# Patient Record
Sex: Female | Born: 1960 | Race: White | Hispanic: No | Marital: Married | Smoking: Former smoker
Health system: Southern US, Community
[De-identification: ages and names within clinical notes are randomized; demographics above are authoritative.]

## PROBLEM LIST (undated history)

## (undated) DIAGNOSIS — T7840XA Allergy, unspecified, initial encounter: Secondary | ICD-10-CM

## (undated) DIAGNOSIS — R0602 Shortness of breath: Secondary | ICD-10-CM

## (undated) DIAGNOSIS — J449 Chronic obstructive pulmonary disease, unspecified: Secondary | ICD-10-CM

## (undated) DIAGNOSIS — J45909 Unspecified asthma, uncomplicated: Secondary | ICD-10-CM

## (undated) HISTORY — DX: Allergy, unspecified, initial encounter: T78.40XA

## (undated) HISTORY — DX: Shortness of breath: R06.02

## (undated) HISTORY — DX: Chronic obstructive pulmonary disease, unspecified: J44.9

## (undated) HISTORY — DX: Unspecified asthma, uncomplicated: J45.909

---

## 1997-04-18 HISTORY — PX: ABDOMINAL HYSTERECTOMY: SHX81

## 2004-12-10 ENCOUNTER — Ambulatory Visit: Payer: Self-pay | Admitting: Obstetrics and Gynecology

## 2005-09-29 ENCOUNTER — Ambulatory Visit: Payer: Self-pay | Admitting: Obstetrics and Gynecology

## 2006-08-17 IMAGING — US ULTRASOUND LEFT BREAST
1 series · 7 of 7 positions shown · non-contrast
Comparison: none

REASON FOR EXAM: LT breast lump  US PRN
COMMENTS:

[Series 1: ultrasound left breast · 7 of 7 slices shown]
[im 1/7]
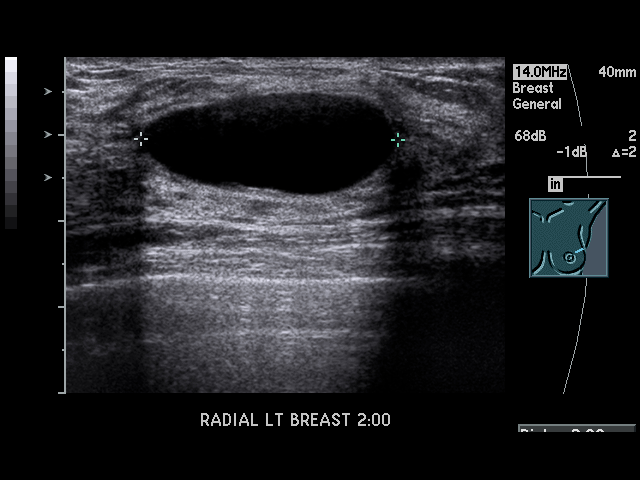
[im 2/7]
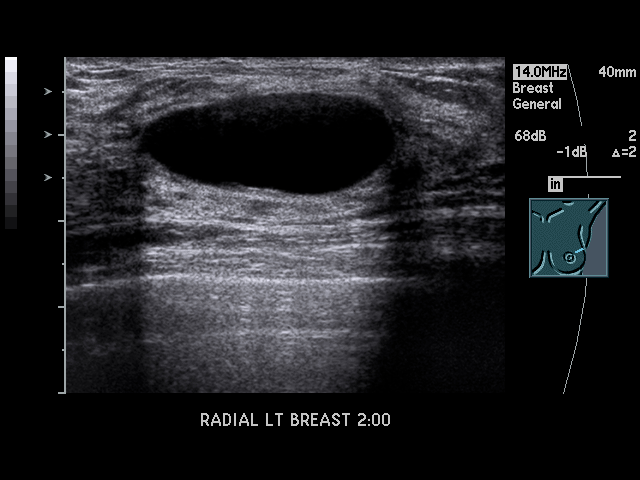
[im 3/7]
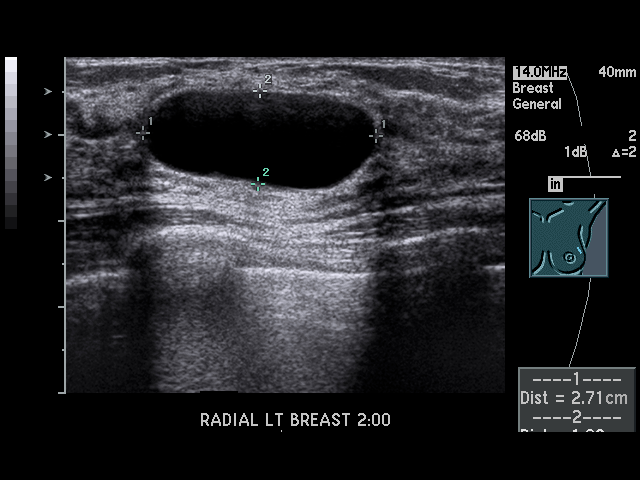
[im 4/7]
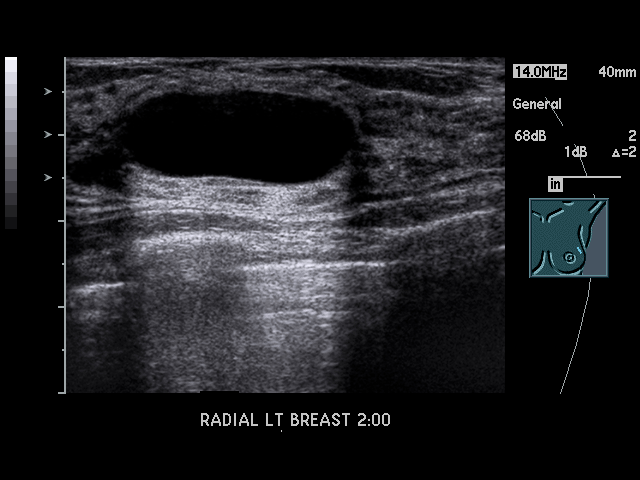
[im 5/7]
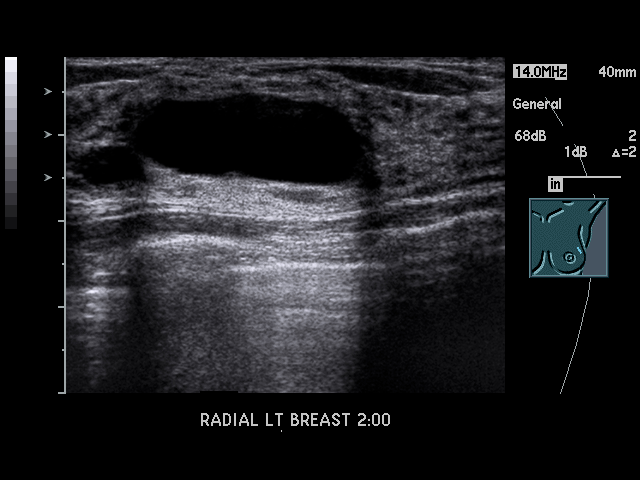
[im 6/7]
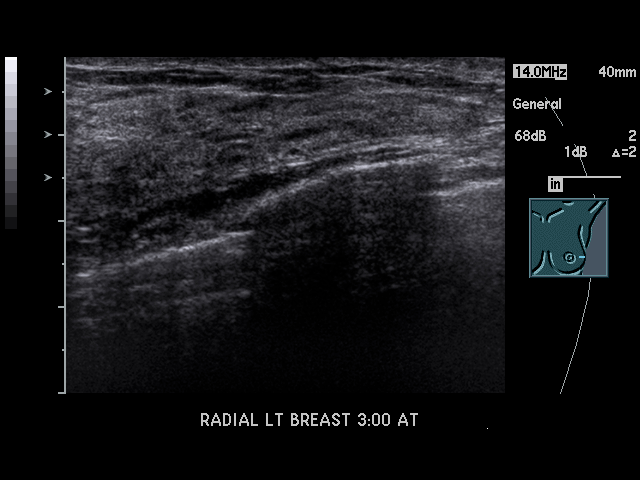
[im 7/7]
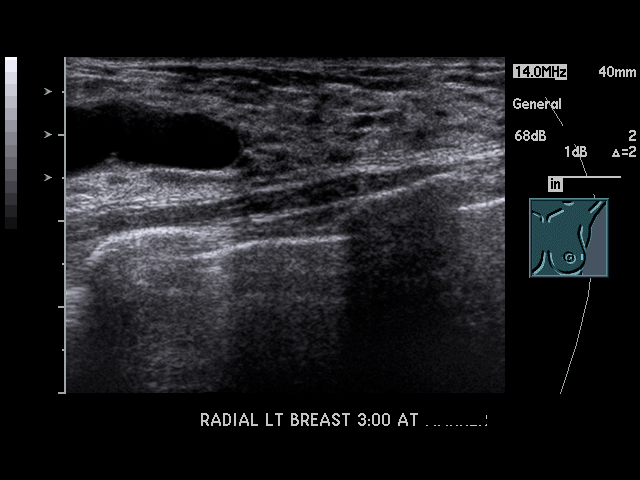

[7 of 7 positions shown; findings below may reference images not displayed]

PROCEDURE:     US  - US BREAST LEFT  - December 10, 2004 [DATE]

RESULT:        The patient is undergoing evaluation for an area of palpable
fullness on the LEFT.

Ultrasound today at the 2 o'clock position of the LEFT breast reveals an
ovoid simple-appearing cyst measuring 2.7 x 1.1 x 3.0 cm.  There are no
internal echoes and there is enhanced through transmission.   This is in
reasonable agreement with the area of clinical findings and mammographic
findings.  Please see the mammogram of this same date for final
recommendations and BI-RADS classification.
IMPRESSION: See above.

## 2007-06-06 IMAGING — US ULTRASOUND LEFT BREAST
1 series · 3 of 3 positions shown · non-contrast
Comparison: none

REASON FOR EXAM: LEFT breast mastodynia
COMMENTS:

PROCEDURE:     US  - US BREAST LEFT  - September 29, 2005  [DATE]
RESULT:     There is a smoothly marginated anechoic structure with enhanced
through transmission at the 2 o'clock position. It measures 2 x 1.1 x
cm.  It is most compatible with a benign cyst.

[Series 1: ultrasound left breast · 3 of 3 slices shown]
[im 1/3]
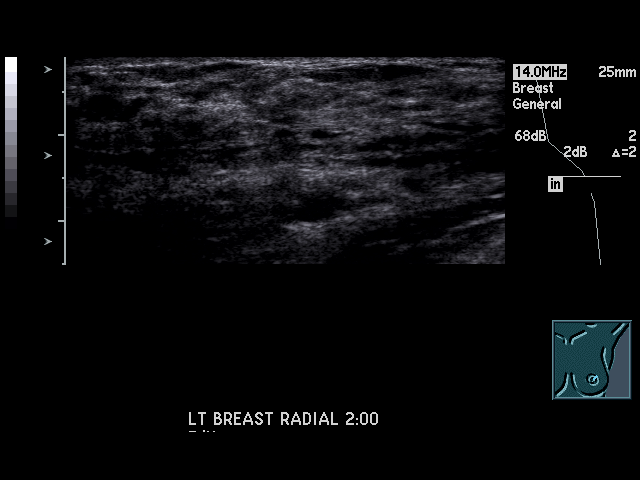
[im 2/3]
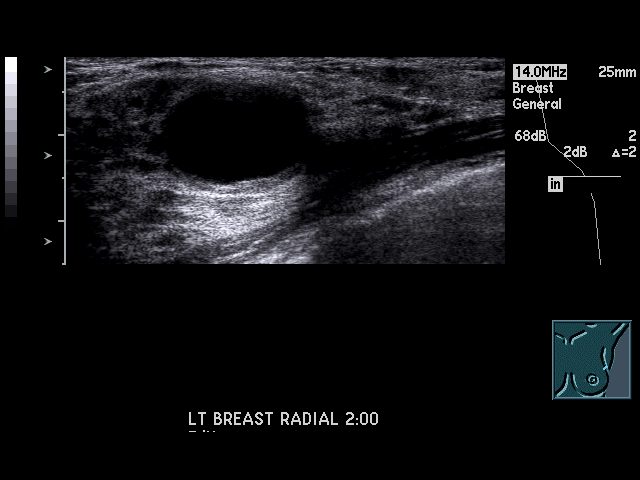
[im 3/3]
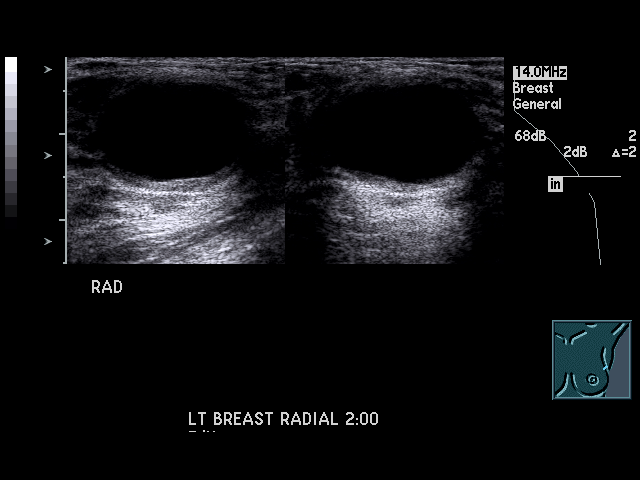

[3 of 3 positions shown; findings below may reference images not displayed]

IMPRESSION: There is a cystic-appearing structure at the 2 o'clock position of the LEFT
breast.  Please see the dictation of the mammogram of this same day for
final recommendations and BI-RADS classification.

By report there has not been dramatic change in the appearance of this
cystic structure since the prior ultrasound in Tuesday November, 2004.

## 2015-05-07 ENCOUNTER — Other Ambulatory Visit: Payer: Self-pay

## 2015-05-13 NOTE — Telephone Encounter (Signed)
Medication has been refused due to patient needs to schedule a medication refill appointment, has not been seen in this office

## 2015-06-03 ENCOUNTER — Ambulatory Visit (INDEPENDENT_AMBULATORY_CARE_PROVIDER_SITE_OTHER): Payer: 59 | Admitting: Family Medicine

## 2015-06-03 ENCOUNTER — Encounter: Payer: Self-pay | Admitting: Family Medicine

## 2015-06-03 VITALS — BP 110/67 | HR 102 | Temp 97.8°F | Resp 20 | Ht 60.0 in | Wt 128.2 lb

## 2015-06-03 DIAGNOSIS — J449 Chronic obstructive pulmonary disease, unspecified: Secondary | ICD-10-CM | POA: Diagnosis not present

## 2015-06-03 MED ORDER — PROAIR HFA 108 (90 BASE) MCG/ACT IN AERS: 1.0000 | INHALATION_SPRAY | Freq: Four times a day (QID) | RESPIRATORY_TRACT | Status: DC | PRN

## 2015-06-03 MED ORDER — SYMBICORT 160-4.5 MCG/ACT IN AERO
2.0000 | INHALATION_SPRAY | Freq: Two times a day (BID) | RESPIRATORY_TRACT | Status: DC
Start: 2015-06-03 — End: 2015-08-28

## 2015-06-03 NOTE — Progress Notes (Signed)
Name: Brittney Doyle   MRN: [id number]    DOB: 12-Jun-1960   Date:06/03/2015       Progress Note  Subjective  Chief Complaint  Chief Complaint  Patient presents with  . Medication Refill    symbicort 160-4.5 mcg/act / proair     HPI  COPD History of COPD, main symptoms of dyspnea, especially with activity. No coughing, wheezing, increased sputum production, etc.  Takes Symbicort twice daily and ProAir when needed (last use was 2 months ago).  Past Medical History  Diagnosis Date  . Shortness of breath   . COPD (chronic obstructive pulmonary disease) [facility])     Past Surgical History  Procedure Laterality Date  . Abdominal hysterectomy  1999    Family History  Problem Relation Age of Onset  . Heart attack Mother     Died of MI at age 71  . Heart attack Father     Died of MI at age 66  . Heart attack Brother     Social History   Social History  . Marital Status: Married    Spouse Name: N/A  . Number of Children: N/A  . Years of Education: N/A   Occupational History  . Not on file.   Social History Main Topics  . Smoking status: Former Research scientist (life sciences)  . Smokeless tobacco: Never Used  . Alcohol Use: No     Comment: ocaasional  . Drug Use: No  . Sexual Activity: No   Other Topics Concern  . Not on file   Social History Narrative  . No narrative on file     Current outpatient prescriptions:  .  fexofenadine-pseudoephedrine (ALLEGRA-D 24) 180-240 MG 24 hr tablet, Take 1 tablet by mouth daily., Disp: , Rfl:  .  PROAIR HFA 108 (90 Base) MCG/ACT inhaler, , Disp: , Rfl:  .  SYMBICORT 160-4.5 MCG/ACT inhaler, , Disp: , Rfl:   No Known Allergies   Review of Systems  Constitutional: Negative for fever and chills.  Respiratory: Negative for cough, sputum production and shortness of breath.     Objective  Filed Vitals:   06/03/15 1556  BP: 110/67  Pulse: 102  Temp: 97.8 F (36.6 C)  TempSrc: Oral  Resp: 20  Height: 5' (1.524 m)  Weight: 128 lb 3.2 oz  (58.151 kg)  SpO2: 96%    Physical Exam  Constitutional: She is oriented to person, place, and time and well-developed, well-nourished, and in no distress.  HENT:  Head: Normocephalic and atraumatic.  Cardiovascular: Normal rate and regular rhythm.   Pulmonary/Chest: Effort normal and breath sounds normal.  Neurological: She is alert and oriented to person, place, and time.  Nursing note and vitals reviewed.    Assessment & Plan  1. Chronic obstructive pulmonary disease, unspecified COPD type ([place]) Symptoms of COPD stable on rescue inhaler and ICS-LABA combination. - PROAIR HFA 108 (90 Base) MCG/ACT inhaler; Inhale 1 puff into the lungs every 6 (six) hours as needed for wheezing or shortness of breath.  Dispense: 3 Inhaler; Refill: 0 - SYMBICORT 160-4.5 MCG/ACT inhaler; Inhale 2 puffs into the lungs 2 (two) times daily.  Dispense: 3 Inhaler; Refill: 0   Ricci Paff Asad A. [facility] 06/03/2015 4:14 PM

## 2015-07-13 ENCOUNTER — Other Ambulatory Visit: Payer: Self-pay | Admitting: Family Medicine

## 2015-07-21 ENCOUNTER — Emergency Department
Admission: EM | Admit: 2015-07-21 | Discharge: 2015-07-21 | Disposition: A | Discharge status: Home / Self Care | Payer: 59 | Attending: Emergency Medicine | Admitting: Emergency Medicine

## 2015-07-21 ENCOUNTER — Encounter: Payer: Self-pay | Admitting: Family Medicine

## 2015-07-21 ENCOUNTER — Emergency Department: Payer: 59

## 2015-07-21 ENCOUNTER — Ambulatory Visit (INDEPENDENT_AMBULATORY_CARE_PROVIDER_SITE_OTHER): Payer: 59 | Admitting: Family Medicine

## 2015-07-21 VITALS — BP 118/67 | HR 109 | Temp 97.7°F | Resp 20 | Ht 60.0 in | Wt 120.0 lb

## 2015-07-21 DIAGNOSIS — Z87891 Personal history of nicotine dependence: Secondary | ICD-10-CM | POA: Diagnosis not present

## 2015-07-21 DIAGNOSIS — J440 Chronic obstructive pulmonary disease with acute lower respiratory infection: Secondary | ICD-10-CM | POA: Insufficient documentation

## 2015-07-21 DIAGNOSIS — J209 Acute bronchitis, unspecified: Secondary | ICD-10-CM

## 2015-07-21 DIAGNOSIS — J441 Chronic obstructive pulmonary disease with (acute) exacerbation: Secondary | ICD-10-CM | POA: Diagnosis not present

## 2015-07-21 DIAGNOSIS — R0602 Shortness of breath: Secondary | ICD-10-CM | POA: Diagnosis present

## 2015-07-21 LAB — CBC
HCT: 43.9 % (ref 35.0–47.0)
Hemoglobin: 15.2 g/dL (ref 12.0–16.0)
MCH: 32.6 pg (ref 26.0–34.0)
MCHC: 34.6 g/dL (ref 32.0–36.0)
MCV: 94 fL (ref 80.0–100.0)
PLATELETS: 273 10*3/uL (ref 150–440)
RBC: 4.67 MIL/uL (ref 3.80–5.20)
RDW: 13 % (ref 11.5–14.5)
WBC: 5.2 10*3/uL (ref 3.6–11.0)

## 2015-07-21 LAB — BASIC METABOLIC PANEL
Anion gap: 6 (ref 5–15)
BUN: 7 mg/dL (ref 6–20)
CHLORIDE: 108 mmol/L (ref 101–111)
CO2: 23 mmol/L (ref 22–32)
CREATININE: 0.61 mg/dL (ref 0.44–1.00)
Calcium: 9.3 mg/dL (ref 8.9–10.3)
GFR calc non Af Amer: >60 mL/min (ref >60)
Glucose, Bld: 110 mg/dL — ABNORMAL HIGH (ref 65–99)
Potassium: 3.4 mmol/L — ABNORMAL LOW (ref 3.5–5.1)
SODIUM: 137 mmol/L (ref 135–145)

## 2015-07-21 MED ORDER — ONDANSETRON HCL 4 MG/2ML IJ SOLN
INTRAMUSCULAR | Status: AC
  Filled 2015-07-21: qty 2

## 2015-07-21 MED ORDER — ALBUTEROL SULFATE (2.5 MG/3ML) 0.083% IN NEBU
5.0000 mg | INHALATION_SOLUTION | Freq: Once | RESPIRATORY_TRACT | Status: AC
  Administered 2015-07-21: 5 mg via RESPIRATORY_TRACT

## 2015-07-21 MED ORDER — DOXYCYCLINE HYCLATE 100 MG PO CAPS: 100.0000 mg | ORAL_CAPSULE | Freq: Two times a day (BID) | ORAL | Status: DC

## 2015-07-21 MED ORDER — PREDNISONE 20 MG PO TABS: 40.0000 mg | ORAL_TABLET | Freq: Every day | ORAL | Status: DC

## 2015-07-21 MED ORDER — ALBUTEROL SULFATE (2.5 MG/3ML) 0.083% IN NEBU
INHALATION_SOLUTION | RESPIRATORY_TRACT | Status: AC
  Filled 2015-07-21: qty 3

## 2015-07-21 NOTE — ED Notes (Signed)
EDP at bedside  

## 2015-07-21 NOTE — ED Provider Notes (Signed)
[facility] Emergency Department Provider Note  ____________________________________________  Time seen: 6:00 PM  I have reviewed the triage vital signs and the nursing notes.   HISTORY  Chief Complaint Shortness of Breath    HPI Brittney Doyle is a 55 y.o. female sent to the ED due to persistent shortness of breath. She is been treated with 10 days of Augmentin by primary care due to her pneumonia. She also has a history of COPD. She reports she has been continuing to have some shortness of breath worse with exertion, and some nonproductive cough. No fever. No chest pain. No dizziness or other exertional symptoms. Take Symbicort and albuterol at home.She does report decreased oral intake over the last few days due to not having much of an appetite.   No recent travel trauma hospitalizations or surgeries. No history of DVT or PE.  Past Medical History  Diagnosis Date  . COPD (chronic obstructive pulmonary disease) ([place])   . Shortness of breath      Patient Active Problem List   Diagnosis Date Noted  . COPD with acute exacerbation ([place]) 07/21/2015  . COPD (chronic obstructive pulmonary disease) ([place]) 06/03/2015     Past Surgical History  Procedure Laterality Date  . Abdominal hysterectomy  1999     Current Outpatient Rx  Name  Route  Sig  Dispense  Refill  . amoxicillin-clavulanate (AUGMENTIN) 875-125 MG tablet      TAKE 1 TABLET BY MOUTH EVERY 12 HOURS FOR 10 DAYS      0   . doxycycline (VIBRAMYCIN) 100 MG capsule   Oral   Take 1 capsule (100 mg total) by mouth 2 (two) times daily.   28 capsule   0   . fexofenadine-pseudoephedrine (ALLEGRA-D 24) 180-240 MG 24 hr tablet   Oral   Take 1 tablet by mouth daily.         . fluticasone (FLONASE) 50 MCG/ACT nasal spray      SPRAY 2 SPRAY,SUSPENSION ONCE A DAY FOR 7 DAYS.2 PUFFS IN EACH NOSTRIL ONCE A DAY FOR 7 DAYS      0   . predniSONE (DELTASONE) 20 MG tablet   Oral   Take 2  tablets (40 mg total) by mouth daily.   8 tablet   0   . PROAIR HFA 108 (90 Base) MCG/ACT inhaler   Inhalation   Inhale 1 puff into the lungs every 6 (six) hours as needed for wheezing or shortness of breath.   3 Inhaler   0     Dispense as written.   . SYMBICORT 160-4.5 MCG/ACT inhaler   Inhalation   Inhale 2 puffs into the lungs 2 (two) times daily.   3 Inhaler   0     Dispense as written.      Allergies Review of patient's allergies indicates no known allergies.   Family History  Problem Relation Age of Onset  . Heart attack Mother     Died of MI at age 7  . Heart attack Father     Died of MI at age 72  . Heart attack Brother     Social History Social History  Substance Use Topics  . Smoking status: Former Research scientist (life sciences)  . Smokeless tobacco: Never Used  . Alcohol Use: No     Comment: ocaasional    Review of Systems  Constitutional:   No fever or chills. No weight changes Eyes:   No vision changes.  ENT:   No sore  throat. No rhinorrhea. Cardiovascular:   No chest pain. Respiratory:   Positive shortness of breath and nonproductive cough. Gastrointestinal:   Negative for abdominal pain, vomiting and diarrhea.  No BRBPR or melena. Genitourinary:   Negative for dysuria or difficulty urinating. Musculoskeletal:   Negative for focal pain or swelling Skin:   Negative for rash. Neurological:   Negative for headaches, focal weakness or numbness.  10-point ROS otherwise negative.  ____________________________________________   PHYSICAL EXAM:  VITAL SIGNS: ED Triage Vitals  Enc Vitals Group     BP 07/21/15 1421 138/87 mmHg     Pulse Rate 07/21/15 1421 100     Resp 07/21/15 1421 20     Temp 07/21/15 1421 97.6 F (36.4 C)     Temp Source 07/21/15 1421 Oral     SpO2 07/21/15 1421 96 %     Weight 07/21/15 1421 120 lb (54.432 kg)     Height 07/21/15 1421 5' (1.524 m)     Head Cir --      Peak Flow --      Pain Score --      Pain Loc --      Pain Edu? --       Excl. in [place]? --     Vital signs reviewed, nursing assessments reviewed.   Constitutional:   Alert and oriented. Well appearing and in no distress. Eyes:   No scleral icterus. No conjunctival pallor. PERRL. EOMI ENT   Head:   Normocephalic and atraumatic.   Nose:   No congestion/rhinnorhea. No septal hematoma   Mouth/Throat:   MMM, no pharyngeal erythema. No peritonsillar mass.    Neck:   No stridor. No SubQ emphysema. No meningismus. Hematological/Lymphatic/Immunilogical:   No cervical lymphadenopathy. Cardiovascular:   RRR. Symmetric bilateral radial and DP pulses.  No murmurs.  Respiratory:   Normal respiratory effort without tachypnea nor retractions. Diminished air entry diffusely, normal expiratory phase, no expiratory wheezing. No focal crackles are consolidative findings. Gastrointestinal:   Soft and nontender. Non distended. There is no CVA tenderness.  No rebound, rigidity, or guarding. Genitourinary:   deferred Musculoskeletal:   Nontender with normal range of motion in all extremities. No joint effusions.  No lower extremity tenderness.  No edema. Neurologic:   Normal speech and language.  CN 2-10 normal. Motor grossly intact. No gross focal neurologic deficits are appreciated.  Skin:    Skin is warm, dry and intact. No rash noted.  No petechiae, purpura, or bullae. Psychiatric:   Mood and affect are normal. ____________________________________________    LABS (pertinent positives/negatives) (all labs ordered are listed, but only abnormal results are displayed) Labs Reviewed  BASIC METABOLIC PANEL - Abnormal; Notable for the following:    Potassium 3.4 (*)    Glucose, Bld 110 (*)    All other components within normal limits  CBC   ____________________________________________   EKG  Interpreted by me Normal sinus rhythm rate of 92, normal axis, normal intervals. Normal QRS ST segments and T waves.  ____________________________________________     RADIOLOGY  Chest x-ray unremarkable, consistent with COPD  ____________________________________________   PROCEDURES   ____________________________________________   INITIAL IMPRESSION / ASSESSMENT AND PLAN / ED COURSE  Pertinent labs & imaging results that were available during my care of the patient were reviewed by me and considered in my medical decision making (see chart for details).  Patient well appearing no acute distress. May be having a mild COPD exacerbation but no significant wheezing or bronchospasm or hypoxia.  We'll start her on a short burst of steroids. Also continue her on doxycycline given her underlying lung disease. Patient is comfortable with this and wants to go home. Vital signs are unremarkable. She was mildly tachycardic in triage but this has improved just on recheck in the treatment area. I think this is properly due to decreased oral intake. Have low suspicion for MI heart failure or PE. Return precautions given, patient will return if she develops any chest pain or worsening symptoms.     ____________________________________________   FINAL CLINICAL IMPRESSION(S) / ED DIAGNOSES  Final diagnoses:  Acute bronchitis, unspecified organism      Carrie Mew, MD 07/21/15 1910

## 2015-07-21 NOTE — Progress Notes (Signed)
Name: Brittney Doyle   MRN: [id number]    DOB: 09-07-1960   Date:07/21/2015       Progress Note  Subjective  Chief Complaint  Chief Complaint  Patient presents with  . Acute Visit    Wheezing, chest tightness, SOB    HPI  Pt. Presents with 2 weeks history of sore throat, dry cough, and fevers, chills. She was prescribed Amoxicillin, Flonase, and Cetirizine by [facility] through her work. She felt better until last week but then started experiencing worsening shortness of breath, chest tightness and wheezing in the last 2-3 days. She is not producing any mucus, fever hs resolved, throat is till somewhat sore. Short of breath with walking and even sitting still.   Past Medical History  Diagnosis Date  . COPD (chronic obstructive pulmonary disease) ([place])   . Shortness of breath     Past Surgical History  Procedure Laterality Date  . Abdominal hysterectomy  1999    Family History  Problem Relation Age of Onset  . Heart attack Mother     Died of MI at age 55  . Heart attack Father     Died of MI at age 85  . Heart attack Brother     Social History   Social History  . Marital Status: Married    Spouse Name: N/A  . Number of Children: N/A  . Years of Education: N/A   Occupational History  . Not on file.   Social History Main Topics  . Smoking status: Former Research scientist (life sciences)  . Smokeless tobacco: Never Used  . Alcohol Use: No     Comment: ocaasional  . Drug Use: No  . Sexual Activity: No   Other Topics Concern  . Not on file   Social History Narrative     Current outpatient prescriptions:  .  amoxicillin-clavulanate (AUGMENTIN) 875-125 MG tablet, TAKE 1 TABLET BY MOUTH EVERY 12 HOURS FOR 10 DAYS, Disp: , Rfl: 0 .  fexofenadine-pseudoephedrine (ALLEGRA-D 24) 180-240 MG 24 hr tablet, Take 1 tablet by mouth daily., Disp: , Rfl:  .  fluticasone (FLONASE) 50 MCG/ACT nasal spray, SPRAY 2 SPRAY,SUSPENSION ONCE A DAY FOR 7 DAYS.2 PUFFS IN EACH NOSTRIL ONCE A DAY FOR 7 DAYS, Disp: ,  Rfl: 0 .  PROAIR HFA 108 (90 Base) MCG/ACT inhaler, Inhale 1 puff into the lungs every 6 (six) hours as needed for wheezing or shortness of breath., Disp: 3 Inhaler, Rfl: 0 .  SYMBICORT 160-4.5 MCG/ACT inhaler, Inhale 2 puffs into the lungs 2 (two) times daily., Disp: 3 Inhaler, Rfl: 0  No Known Allergies   Review of Systems  Constitutional: Negative for fever and chills.  HENT: Positive for sore throat.   Respiratory: Positive for cough, shortness of breath and wheezing. Negative for sputum production.     Objective  Filed Vitals:   07/21/15 1329  BP: 118/67  Pulse: 109  Temp: 97.7 F (36.5 C)  TempSrc: Oral  Resp: 20  Height: 5' (1.524 m)  Weight: 120 lb (54.432 kg)  SpO2: 96%    Physical Exam  Constitutional: She is well-developed, well-nourished, and in no distress.  HENT:  Mouth/Throat: No posterior oropharyngeal erythema.  Cardiovascular: Regular rhythm and normal heart sounds.  Tachycardia present.   Pulmonary/Chest: She has decreased breath sounds. She has no wheezes.  Decreased breath sounds throughout, end-expiratory crackles in right lung field, no wheezes.  Nursing note and vitals reviewed.     Assessment & Plan  1. COPD with acute exacerbation (  HCC) Coughing, dyspnea, and poor air entry on exam, consistent with acute COPD exacerbation. We'll refer patient to the ER for further management and assessment.   Brittney Doyle Asad A. [facility] 07/21/2015 1:35 PM

## 2015-07-21 NOTE — ED Notes (Signed)
Pt c/o SOB over the past 2 weeks, was sent by PCP who stated pt has crackles in lung bases.. States she is on the 10th day of abx.. States she has a nonproductive cough.

## 2015-07-21 NOTE — Discharge Instructions (Signed)

## 2015-07-21 NOTE — ED Notes (Signed)
Pt states she has been on abx amoxicillin for 10 days for A chest cold but states yesterday her SOB increased, upon assessment pt appears somewhat SOB upon excertion, awake and alert

## 2015-07-30 ENCOUNTER — Encounter: Payer: Self-pay | Admitting: Family Medicine

## 2015-07-30 ENCOUNTER — Ambulatory Visit (INDEPENDENT_AMBULATORY_CARE_PROVIDER_SITE_OTHER): Payer: 59 | Admitting: Family Medicine

## 2015-07-30 VITALS — BP 118/66 | HR 95 | Temp 97.8°F | Resp 18 | Ht 60.0 in | Wt 120.1 lb

## 2015-07-30 DIAGNOSIS — J302 Other seasonal allergic rhinitis: Secondary | ICD-10-CM

## 2015-07-30 DIAGNOSIS — J209 Acute bronchitis, unspecified: Secondary | ICD-10-CM | POA: Diagnosis not present

## 2015-07-30 MED ORDER — FLUTICASONE PROPIONATE 50 MCG/ACT NA SUSP: 2.0000 | Freq: Every day | NASAL | Status: DC

## 2015-07-30 MED ORDER — BENZONATATE 200 MG PO CAPS: 200.0000 mg | ORAL_CAPSULE | Freq: Three times a day (TID) | ORAL | Status: DC | PRN

## 2015-07-30 NOTE — Progress Notes (Signed)
Name: Brittney Doyle   MRN: [id number]    DOB: 1960/04/30   Date:07/30/2015       Progress Note  Subjective  Chief Complaint  Chief Complaint  Patient presents with  . Annual Exam    CPE    HPI  Pt. Is here for hospital Follow-up. She was sent to the ER on April 4th for  difficulty breathing. She was evaluated in the ER and diagnosed with Acute bronchitis, started on prednisone for 4 days and Doxycycline for 14 days. She has finished Prednisone and continuing on Doxycycline.  Coughing has improved, breathing is back to baseline.  She is taking Mucinex for residual cough and congestion.  Past Medical History  Diagnosis Date  . COPD (chronic obstructive pulmonary disease) ([place])   . Shortness of breath     Past Surgical History  Procedure Laterality Date  . Abdominal hysterectomy  1999    Family History  Problem Relation Age of Onset  . Heart attack Mother     Died of MI at age 70  . Heart attack Father     Died of MI at age 84  . Heart attack Brother     Social History   Social History  . Marital Status: Married    Spouse Name: N/A  . Number of Children: N/A  . Years of Education: N/A   Occupational History  . Not on file.   Social History Main Topics  . Smoking status: Former Research scientist (life sciences)  . Smokeless tobacco: Never Used  . Alcohol Use: No     Comment: ocaasional  . Drug Use: No  . Sexual Activity: No   Other Topics Concern  . Not on file   Social History Narrative     Current outpatient prescriptions:  .  doxycycline (VIBRAMYCIN) 100 MG capsule, Take 1 capsule (100 mg total) by mouth 2 (two) times daily., Disp: 28 capsule, Rfl: 0 .  fexofenadine-pseudoephedrine (ALLEGRA-D 24) 180-240 MG 24 hr tablet, Take 1 tablet by mouth daily., Disp: , Rfl:  .  fluticasone (FLONASE) 50 MCG/ACT nasal spray, SPRAY 2 SPRAY,SUSPENSION ONCE A DAY FOR 7 DAYS.2 PUFFS IN EACH NOSTRIL ONCE A DAY FOR 7 DAYS, Disp: , Rfl: 0 .  PROAIR HFA 108 (90 Base) MCG/ACT inhaler, Inhale 1  puff into the lungs every 6 (six) hours as needed for wheezing or shortness of breath., Disp: 3 Inhaler, Rfl: 0 .  SYMBICORT 160-4.5 MCG/ACT inhaler, Inhale 2 puffs into the lungs 2 (two) times daily., Disp: 3 Inhaler, Rfl: 0  No Known Allergies   Review of Systems  Constitutional: Negative for fever and chills.  Respiratory: Positive for cough and wheezing. Negative for shortness of breath.   Cardiovascular: Negative for chest pain.    Objective  Filed Vitals:   07/30/15 1413  BP: 118/66  Pulse: 95  Temp: 97.8 F (36.6 C)  TempSrc: Oral  Resp: 18  Height: 5' (1.524 m)  Weight: 120 lb 1.6 oz (54.477 kg)  SpO2: 96%    Physical Exam  Constitutional: She is oriented to person, place, and time and well-developed, well-nourished, and in no distress.  HENT:  Head: Normocephalic and atraumatic.  Mouth/Throat: Mucous membranes are normal.  Cardiovascular: Normal rate and regular rhythm.   Pulmonary/Chest: Effort normal and breath sounds normal. No respiratory distress. She has no wheezes.  Neurological: She is alert and oriented to person, place, and time.  Nursing note and vitals reviewed.     Assessment & Plan  1. Acute  bronchitis, unspecified organism We will start on Tessalon to relieve cough. Will finished antibiotic therapy. - benzonatate (TESSALON) 200 MG capsule; Take 1 capsule (200 mg total) by mouth 3 (three) times daily as needed for cough.  Dispense: 20 capsule; Refill: 0  2. Seasonal allergic rhinitis  - fluticasone (FLONASE) 50 MCG/ACT nasal spray; Place 2 sprays into both nostrils daily.  Dispense: 16 g; Refill: 0  Cannon Quinton Asad A. [facility] 07/30/2015 2:30 PM

## 2015-08-28 ENCOUNTER — Other Ambulatory Visit: Payer: Self-pay | Admitting: Family Medicine

## 2015-08-31 ENCOUNTER — Encounter: Payer: 59 | Admitting: Family Medicine

## 2015-09-28 ENCOUNTER — Other Ambulatory Visit: Payer: Self-pay | Admitting: Family Medicine

## 2015-11-09 ENCOUNTER — Ambulatory Visit (INDEPENDENT_AMBULATORY_CARE_PROVIDER_SITE_OTHER): Payer: 59 | Admitting: Family Medicine

## 2015-11-09 ENCOUNTER — Encounter: Payer: Self-pay | Admitting: Family Medicine

## 2015-11-09 DIAGNOSIS — F418 Other specified anxiety disorders: Secondary | ICD-10-CM

## 2015-11-09 DIAGNOSIS — J449 Chronic obstructive pulmonary disease, unspecified: Secondary | ICD-10-CM

## 2015-11-09 MED ORDER — LORAZEPAM 1 MG PO TABS: 1.0000 mg | ORAL_TABLET | Freq: Every day | ORAL | 0 refills | Status: AC | PRN

## 2015-11-09 MED ORDER — TIOTROPIUM BROMIDE MONOHYDRATE 2.5 MCG/ACT IN AERS: 2.0000 | INHALATION_SPRAY | Freq: Every day | RESPIRATORY_TRACT | 2 refills | Status: DC

## 2015-11-09 MED ORDER — PROAIR HFA 108 (90 BASE) MCG/ACT IN AERS: 2.0000 | INHALATION_SPRAY | Freq: Four times a day (QID) | RESPIRATORY_TRACT | 2 refills | Status: DC | PRN

## 2015-11-09 MED ORDER — PREDNISONE 10 MG PO TABS: 10.0000 mg | ORAL_TABLET | Freq: Every day | ORAL | 0 refills | Status: DC

## 2015-11-09 NOTE — Progress Notes (Signed)
Name: Brittney Doyle   MRN: [id number]    DOB: [birth year]   Date:11/09/2015       Progress Note  Subjective  Chief Complaint  Chief Complaint  Patient presents with  . Chills    last week for 3 days  . Shortness of Breath    more than normal last week    Shortness of Breath  This is a recurrent problem. The current episode started in the past 7 days. Pertinent negatives include no chest pain (felt pressure in her chest), fever, orthopnea or sputum production. Her past medical history is significant for COPD.   Anxiety: Pt. Is going to see her daughter who lives in [place] this coming weekend, her husband is going to be driving her halfway and her daughter will pick her up from there. Patient has anxiety from riding in a car on the highway and needs something to help relieve her anxiety for the trip back and forth.   Past Medical History:  Diagnosis Date  . COPD (chronic obstructive pulmonary disease) ([place])   . Shortness of breath     Past Surgical History:  Procedure Laterality Date  . ABDOMINAL HYSTERECTOMY  1999    Family History  Problem Relation Age of Onset  . Heart attack Mother     Died of MI at age 51  . Heart attack Father     Died of MI at age 2  . Heart attack Brother     Social History   Social History  . Marital status: Married    Spouse name: N/A  . Number of children: N/A  . Years of education: N/A   Occupational History  . Not on file.   Social History Main Topics  . Smoking status: Former Research scientist (life sciences)  . Smokeless tobacco: Never Used  . Alcohol use No     Comment: ocaasional  . Drug use: No  . Sexual activity: No   Other Topics Concern  . Not on file   Social History Narrative  . No narrative on file     Current Outpatient Prescriptions:  .  fexofenadine-pseudoephedrine (ALLEGRA-D 24) 180-240 MG 24 hr tablet, Take 1 tablet by mouth daily., Disp: , Rfl:  .  fluticasone (FLONASE) 50 MCG/ACT nasal spray, Use 2 sprays in each  nostril  daily, Disp: 16 g, Rfl: 2 .  PROAIR HFA 108 (90 Base) MCG/ACT inhaler, Inhale 1 puff into the lungs every 6 (six) hours as needed for wheezing or shortness of breath., Disp: 3 Inhaler, Rfl: 0 .  SYMBICORT 160-4.5 MCG/ACT inhaler, Inhale 2 puffs two times  daily, Disp: 30.6 g, Rfl: 2 .  benzonatate (TESSALON) 200 MG capsule, Take 1 capsule (200 mg total) by mouth 3 (three) times daily as needed for cough. (Patient not taking: Reported on 11/09/2015), Disp: 20 capsule, Rfl: 0 .  doxycycline (VIBRAMYCIN) 100 MG capsule, Take 1 capsule (100 mg total) by mouth 2 (two) times daily. (Patient not taking: Reported on 11/09/2015), Disp: 28 capsule, Rfl: 0  No Known Allergies   Review of Systems  Constitutional: Negative for chills and fever.  Respiratory: Positive for shortness of breath. Negative for cough and sputum production.   Cardiovascular: Negative for chest pain (felt pressure in her chest) and orthopnea.  Psychiatric/Behavioral: The patient is nervous/anxious.      Objective  Vitals:   11/09/15 1616  BP: 110/76  Pulse: (!) 102  Resp: 18  Temp: 98.2 F (36.8 C)  SpO2: 95%  Weight: 121  lb 4 oz (55 kg)  Height: 5' (1.524 m)    Physical Exam  Constitutional: She is well-developed, well-nourished, and in no distress.  Cardiovascular: Normal rate, regular rhythm and normal heart sounds.   No murmur heard. Pulmonary/Chest: Breath sounds normal. She has no wheezes. She has no rales (mild crackles in left mid to lower lung field).  Nursing note and vitals reviewed.      Assessment & Plan  1. Chronic obstructive pulmonary disease, unspecified COPD type ([place]) Symptomatically worse after patient had bronchitis with exacerbation of COPD 2 months ago. We will add anticholinergic and a tapering course of prednisone to relieve shortness of breath. - PROAIR HFA 108 (90 Base) MCG/ACT inhaler; Inhale 2 puffs into the lungs every 6 (six) hours as needed for wheezing or shortness of breath.   Dispense: 3 Inhaler; Refill: 2 - Tiotropium Bromide Monohydrate (SPIRIVA RESPIMAT) 2.5 MCG/ACT AERS; Inhale 2 puffs into the lungs daily.  Dispense: 3 Inhaler; Refill: 2 - predniSONE (DELTASONE) 10 MG tablet; Take 1 tablet (10 mg total) by mouth daily with breakfast. 50 40 30 20 10  then STOP.  Dispense: 15 tablet; Refill: 0  2. Situational anxiety Patient has anxiety from being on the [employer]. Prescribed lorazepam to be taken while she is visiting her daughter. - LORazepam (ATIVAN) 1 MG tablet; Take 1 tablet (1 mg total) by mouth daily as needed for anxiety.  Dispense: 2 tablet; Refill: 0   Brittney Doyle A. [facility] 11/09/2015 4:27 PM

## 2015-11-10 ENCOUNTER — Encounter: Payer: Self-pay | Admitting: Family Medicine

## 2015-11-10 MED ORDER — TIOTROPIUM BROMIDE MONOHYDRATE 2.5 MCG/ACT IN AERS: 2.0000 | INHALATION_SPRAY | Freq: Every day | RESPIRATORY_TRACT | 2 refills | Status: DC

## 2015-11-10 NOTE — Addendum Note (Signed)
Addended byManuella Ghazi, Brier Reid A A on: 11/10/2015 09:15 AM   Modules accepted: Orders

## 2016-02-26 ENCOUNTER — Other Ambulatory Visit: Payer: Self-pay | Admitting: Family Medicine

## 2016-02-26 DIAGNOSIS — J449 Chronic obstructive pulmonary disease, unspecified: Secondary | ICD-10-CM

## 2016-07-07 ENCOUNTER — Other Ambulatory Visit: Payer: Self-pay | Admitting: Family Medicine

## 2016-07-07 DIAGNOSIS — J449 Chronic obstructive pulmonary disease, unspecified: Secondary | ICD-10-CM

## 2016-08-27 ENCOUNTER — Other Ambulatory Visit: Payer: Self-pay | Admitting: Family Medicine

## 2016-08-27 DIAGNOSIS — J449 Chronic obstructive pulmonary disease, unspecified: Secondary | ICD-10-CM

## 2016-09-13 ENCOUNTER — Telehealth: Payer: Self-pay | Admitting: Family Medicine

## 2016-09-13 NOTE — Telephone Encounter (Signed)
Pt has scheduled an appointment for this Thursday 09/15/16. She is asking if you would please call in her prescription for spiriva to [facility] because it will take awhile before she receive them through mail.  [phone]

## 2016-09-15 ENCOUNTER — Ambulatory Visit: Payer: Self-pay | Admitting: Family Medicine

## 2016-09-16 ENCOUNTER — Encounter: Payer: Self-pay | Admitting: Family Medicine

## 2016-09-16 ENCOUNTER — Ambulatory Visit (INDEPENDENT_AMBULATORY_CARE_PROVIDER_SITE_OTHER): Payer: 59 | Admitting: Family Medicine

## 2016-09-16 VITALS — BP 112/73 | HR 98 | Temp 97.4°F | Resp 16 | Ht 60.0 in | Wt 114.6 lb

## 2016-09-16 DIAGNOSIS — F418 Other specified anxiety disorders: Secondary | ICD-10-CM | POA: Diagnosis not present

## 2016-09-16 DIAGNOSIS — J301 Allergic rhinitis due to pollen: Secondary | ICD-10-CM | POA: Diagnosis not present

## 2016-09-16 DIAGNOSIS — J449 Chronic obstructive pulmonary disease, unspecified: Secondary | ICD-10-CM | POA: Diagnosis not present

## 2016-09-16 MED ORDER — TIOTROPIUM BROMIDE MONOHYDRATE 2.5 MCG/ACT IN AERS
2.0000 | INHALATION_SPRAY | Freq: Every day | RESPIRATORY_TRACT | 2 refills | Status: DC
Start: 2016-09-16 — End: 2017-04-17

## 2016-09-16 MED ORDER — PROAIR HFA 108 (90 BASE) MCG/ACT IN AERS: INHALATION_SPRAY | RESPIRATORY_TRACT | 2 refills | Status: DC

## 2016-09-16 MED ORDER — FLUTICASONE PROPIONATE 50 MCG/ACT NA SUSP: 2.0000 | Freq: Every day | NASAL | 2 refills | Status: DC

## 2016-09-16 MED ORDER — LORAZEPAM 1 MG PO TABS: 1.0000 mg | ORAL_TABLET | Freq: Every day | ORAL | 1 refills | Status: DC | PRN

## 2016-09-16 MED ORDER — TIOTROPIUM BROMIDE MONOHYDRATE 2.5 MCG/ACT IN AERS: 2.0000 | INHALATION_SPRAY | Freq: Every day | RESPIRATORY_TRACT | 2 refills | Status: DC

## 2016-09-16 MED ORDER — PROAIR HFA 108 (90 BASE) MCG/ACT IN AERS
INHALATION_SPRAY | RESPIRATORY_TRACT | 2 refills | Status: DC
Start: 2016-09-16 — End: 2017-07-10

## 2016-09-16 MED ORDER — SYMBICORT 160-4.5 MCG/ACT IN AERO
INHALATION_SPRAY | RESPIRATORY_TRACT | 2 refills | Status: DC
Start: 2016-09-16 — End: 2016-09-16

## 2016-09-16 MED ORDER — SYMBICORT 160-4.5 MCG/ACT IN AERO: INHALATION_SPRAY | RESPIRATORY_TRACT | 2 refills | Status: DC

## 2016-09-16 NOTE — Progress Notes (Signed)
Name: Brittney Doyle   MRN: [id number]    DOB: Jan 27, 1961   Date:09/16/2016       Progress Note  Subjective  Chief Complaint  Chief Complaint  Patient presents with  . Medication Refill    HPI  COPD: Pt. Presents for medication refills, she is on Symbicort and Spiriva for maintenance and ProAir for rescue breathing. She reports having good days and better days, 2 weeks ago she had some episodes of shortness of breath despite being on her inhalers but feels well now.  She has no coughing or wheezing.   Seasonal Allergies: Has allergic symptoms in both spring and fall, worse in fall, symptoms include sneezing sinus pressure and drainage (worse at night). She takes Flonase which seems to help to some extent.   Anxiety: Pt. is going to see her daughter who lives in [place], trip planned for July 2018. Her husband is going to be driving her halfway and her daughter will pick her up from there. Patient has anxiety from riding in a car on the highway and needs something to help relieve her anxiety for the trip back and forth. She took Lorazepam last time which helped relieve her anxiety.   Past Medical History:  Diagnosis Date  . COPD (chronic obstructive pulmonary disease) ([place])   . Shortness of breath     Past Surgical History:  Procedure Laterality Date  . ABDOMINAL HYSTERECTOMY  1999    Family History  Problem Relation Age of Onset  . Heart attack Mother        Died of MI at age 85  . Heart attack Father        Died of MI at age 21  . Heart attack Brother     Social History   Social History  . Marital status: Married    Spouse name: N/A  . Number of children: N/A  . Years of education: N/A   Occupational History  . Not on file.   Social History Main Topics  . Smoking status: Former Research scientist (life sciences)  . Smokeless tobacco: Never Used  . Alcohol use No     Comment: ocaasional  . Drug use: No  . Sexual activity: No   Other Topics Concern  . Not on file   Social  History Narrative  . No narrative on file     Current Outpatient Prescriptions:  .  benzonatate (TESSALON) 200 MG capsule, Take 1 capsule (200 mg total) by mouth 3 (three) times daily as needed for cough. (Patient not taking: Reported on 11/09/2015), Disp: 20 capsule, Rfl: 0 .  fexofenadine-pseudoephedrine (ALLEGRA-D 24) 180-240 MG 24 hr tablet, Take 1 tablet by mouth daily., Disp: , Rfl:  .  fluticasone (FLONASE) 50 MCG/ACT nasal spray, USE 2 SPRAYS IN EACH  NOSTRIL DAILY, Disp: 48 g, Rfl: 2 .  predniSONE (DELTASONE) 10 MG tablet, Take 1 tablet (10 mg total) by mouth daily with breakfast. 50 40 30 20 10  then STOP. (Patient not taking: Reported on 09/16/2016), Disp: 15 tablet, Rfl: 0 .  PROAIR HFA 108 (90 Base) MCG/ACT inhaler, USE 2 PUFFS EVERY 6 HOURS  AS NEEDED FOR WHEEZING OR  SHORTNESS OF BREATH, Disp: 34 g, Rfl: 2 .  SYMBICORT 160-4.5 MCG/ACT inhaler, INHALE 2 PUFFS TWO TIMES  DAILY, Disp: 30.6 g, Rfl: 2 .  Tiotropium Bromide Monohydrate (SPIRIVA RESPIMAT) 2.5 MCG/ACT AERS, Inhale 2 puffs into the lungs daily., Disp: 3 Inhaler, Rfl: 2  No Known Allergies   ROS  Please see  history of present illness for complete discussion of ROS  Objective  Vitals:   09/16/16 1432  BP: 112/73  Pulse: 98  Resp: 16  Temp: 97.4 F (36.3 C)  TempSrc: Oral  SpO2: 97%  Weight: 114 lb 9.6 oz (52 kg)  Height: 5' (1.524 m)    Physical Exam  Constitutional: She is oriented to person, place, and time and well-developed, well-nourished, and in no distress.  HENT:  Head: Normocephalic and atraumatic.  Mouth/Throat: Mucous membranes are normal. No posterior oropharyngeal erythema.  Nasal turbinates hypertrophied, mucosal inflammation.   Cardiovascular: Normal rate, regular rhythm and normal heart sounds.   Pulmonary/Chest: Effort normal and breath sounds normal. No respiratory distress. She has no wheezes.  Neurological: She is alert and oriented to person, place, and time.  Nursing note and vitals  reviewed.    Assessment & Plan 1. Chronic obstructive pulmonary disease, unspecified COPD type ([place]) Stable, continue on Symbicort, Pro Air and Spiriva. Sample for Trelegy provided to see if it works well for patient and has no side effects.  - Tiotropium Bromide Monohydrate (SPIRIVA RESPIMAT) 2.5 MCG/ACT AERS; Inhale 2 puffs into the lungs daily.  Dispense: 3 Inhaler; Refill: 2 - SYMBICORT 160-4.5 MCG/ACT inhaler; INHALE 2 PUFFS TWO TIMES  DAILY  Dispense: 30.6 g; Refill: 2 - PROAIR HFA 108 (90 Base) MCG/ACT inhaler; USE 2 PUFFS EVERY 6 HOURS  AS NEEDED FOR WHEEZING OR  SHORTNESS OF BREATH  Dispense: 34 g; Refill: 2  2. Seasonal allergic rhinitis due to pollen  - fluticasone (FLONASE) 50 MCG/ACT nasal spray; Place 2 sprays into both nostrils daily.  Dispense: 48 g; Refill: 2  3. Situational anxiety As in history of present illness, prescribed lorazepam to be taken for the trip.  - LORazepam (ATIVAN) 1 MG tablet; Take 1 tablet (1 mg total) by mouth daily as needed for anxiety.  Dispense: 2 tablet; Refill: 1    Winfield Caba [facility] 09/16/2016 2:43 PM

## 2016-09-16 NOTE — Telephone Encounter (Signed)
Patient was seen in office today 09/16/2016

## 2016-10-24 ENCOUNTER — Encounter: Payer: Self-pay | Admitting: Family Medicine

## 2017-03-02 ENCOUNTER — Encounter: Payer: Self-pay | Admitting: Family Medicine

## 2017-03-02 ENCOUNTER — Other Ambulatory Visit: Payer: Self-pay | Admitting: Family Medicine

## 2017-03-02 DIAGNOSIS — Z1231 Encounter for screening mammogram for malignant neoplasm of breast: Secondary | ICD-10-CM

## 2017-03-23 ENCOUNTER — Ambulatory Visit
Admission: RE | Admit: 2017-03-23 | Discharge: 2017-03-23 | Disposition: A | Discharge status: Home / Self Care | Payer: 59 | Source: Ambulatory Visit | Attending: Family Medicine | Admitting: Family Medicine

## 2017-03-23 DIAGNOSIS — R928 Other abnormal and inconclusive findings on diagnostic imaging of breast: Secondary | ICD-10-CM | POA: Insufficient documentation

## 2017-03-23 DIAGNOSIS — N6489 Other specified disorders of breast: Secondary | ICD-10-CM | POA: Diagnosis not present

## 2017-03-23 DIAGNOSIS — Z1231 Encounter for screening mammogram for malignant neoplasm of breast: Secondary | ICD-10-CM | POA: Diagnosis present

## 2017-03-28 ENCOUNTER — Other Ambulatory Visit: Payer: Self-pay | Admitting: Family Medicine

## 2017-03-28 DIAGNOSIS — N6489 Other specified disorders of breast: Secondary | ICD-10-CM

## 2017-03-28 DIAGNOSIS — R928 Other abnormal and inconclusive findings on diagnostic imaging of breast: Secondary | ICD-10-CM

## 2017-03-31 ENCOUNTER — Ambulatory Visit
Admission: RE | Admit: 2017-03-31 | Discharge: 2017-03-31 | Disposition: A | Discharge status: Home / Self Care | Payer: 59 | Source: Ambulatory Visit | Attending: Family Medicine | Admitting: Family Medicine

## 2017-03-31 DIAGNOSIS — N6489 Other specified disorders of breast: Secondary | ICD-10-CM | POA: Insufficient documentation

## 2017-03-31 DIAGNOSIS — R928 Other abnormal and inconclusive findings on diagnostic imaging of breast: Secondary | ICD-10-CM | POA: Insufficient documentation

## 2017-04-17 ENCOUNTER — Other Ambulatory Visit: Payer: Self-pay | Admitting: Family Medicine

## 2017-04-17 DIAGNOSIS — J449 Chronic obstructive pulmonary disease, unspecified: Secondary | ICD-10-CM

## 2017-04-25 ENCOUNTER — Telehealth: Payer: Self-pay | Admitting: Family Medicine

## 2017-04-25 NOTE — Telephone Encounter (Signed)
Copied from [place] [id number]. Topic: Quick Communication - See Telephone Encounter >> Apr 25, 2017  9:20 AM Robina Ade, Helene Kelp D wrote: CRM for notification. See Telephone encounter for: 04/25/17. Patient called and would like to talk to provider or CMA about her mammogram and [place] that she had done last month.

## 2017-04-26 NOTE — Telephone Encounter (Signed)
Left a message to give our office a call back.

## 2017-04-27 ENCOUNTER — Telehealth: Payer: Self-pay

## 2017-04-27 NOTE — Telephone Encounter (Signed)
Copied from [place] [id number]. Topic: General - Other >> Apr 27, 2017 11:26 AM Cecelia Byars, NT wrote: Reason for CRM: Patient called back in regards to message left for her to call yesterday about her concerns please call her at  [phone]

## 2017-04-27 NOTE — Telephone Encounter (Signed)
Returned call and left a voice message for the patient for call back. Looking back at her records, she had a diagnostic mammogram and ultrasound performed which showed an island of dense fibroglandular tissue over the 12:00 position on the right breast, radiologist recommended yearly bilateral screening mammograms

## 2017-06-05 ENCOUNTER — Other Ambulatory Visit: Payer: Self-pay | Admitting: Family Medicine

## 2017-06-05 DIAGNOSIS — J449 Chronic obstructive pulmonary disease, unspecified: Secondary | ICD-10-CM

## 2017-06-05 NOTE — Telephone Encounter (Signed)
Last offce note reviewed Rx approved

## 2017-07-10 ENCOUNTER — Other Ambulatory Visit: Payer: Self-pay

## 2017-07-10 DIAGNOSIS — J301 Allergic rhinitis due to pollen: Secondary | ICD-10-CM

## 2017-07-10 DIAGNOSIS — J449 Chronic obstructive pulmonary disease, unspecified: Secondary | ICD-10-CM

## 2017-07-11 ENCOUNTER — Other Ambulatory Visit: Payer: Self-pay | Admitting: Family Medicine

## 2017-07-11 DIAGNOSIS — J449 Chronic obstructive pulmonary disease, unspecified: Secondary | ICD-10-CM

## 2017-07-11 MED ORDER — PROAIR HFA 108 (90 BASE) MCG/ACT IN AERS: 2.0000 | INHALATION_SPRAY | RESPIRATORY_TRACT | 0 refills | Status: DC | PRN

## 2017-07-11 MED ORDER — TIOTROPIUM BROMIDE MONOHYDRATE 2.5 MCG/ACT IN AERS: 2.0000 | INHALATION_SPRAY | Freq: Every day | RESPIRATORY_TRACT | 1 refills | Status: DC

## 2017-07-11 MED ORDER — FLUTICASONE PROPIONATE 50 MCG/ACT NA SUSP: 2.0000 | Freq: Every day | NASAL | 1 refills | Status: DC

## 2017-07-11 MED ORDER — PROAIR HFA 108 (90 BASE) MCG/ACT IN AERS: 2.0000 | INHALATION_SPRAY | RESPIRATORY_TRACT | 2 refills | Status: DC | PRN

## 2017-07-12 ENCOUNTER — Other Ambulatory Visit: Payer: Self-pay | Admitting: Family Medicine

## 2017-07-12 DIAGNOSIS — J449 Chronic obstructive pulmonary disease, unspecified: Secondary | ICD-10-CM

## 2017-07-12 MED ORDER — PROAIR HFA 108 (90 BASE) MCG/ACT IN AERS: 2.0000 | INHALATION_SPRAY | RESPIRATORY_TRACT | 0 refills | Status: DC | PRN

## 2017-07-12 NOTE — Progress Notes (Signed)
Sent to mail order

## 2017-09-04 ENCOUNTER — Telehealth: Payer: Self-pay

## 2017-09-04 DIAGNOSIS — J449 Chronic obstructive pulmonary disease, unspecified: Secondary | ICD-10-CM

## 2017-09-04 MED ORDER — PROAIR HFA 108 (90 BASE) MCG/ACT IN AERS: 2.0000 | INHALATION_SPRAY | RESPIRATORY_TRACT | 0 refills | Status: DC | PRN

## 2017-09-04 NOTE — Telephone Encounter (Signed)
Left detailed voicemial 

## 2017-09-04 NOTE — Telephone Encounter (Signed)
Patient needs an appointment; she is using a lot of rescue inhaler Thank you

## 2017-09-04 NOTE — Telephone Encounter (Addendum)
[facility] said [facility] only [facility] her one haler instead of 3 due to how rx was written. Pt did not requesting inhaler [facility] on automatic refill and they requesting med before she ran out and only sent one. Pt needs 3 inhaler for 90 day supply due to cost. Pt has schedule an appt 09-28-17

## 2017-09-04 NOTE — Addendum Note (Signed)
Addended by: Ivett Luebbe, Satira Anis on: 09/04/2017 11:43 AM   Modules accepted: Orders

## 2017-09-28 ENCOUNTER — Encounter: Payer: Self-pay | Admitting: Family Medicine

## 2017-09-28 ENCOUNTER — Ambulatory Visit (INDEPENDENT_AMBULATORY_CARE_PROVIDER_SITE_OTHER): Payer: Managed Care, Other (non HMO) | Admitting: Family Medicine

## 2017-09-28 ENCOUNTER — Other Ambulatory Visit: Payer: Self-pay | Admitting: Family Medicine

## 2017-09-28 VITALS — BP 136/86 | HR 94 | Temp 97.5°F | Resp 14 | Ht 60.0 in | Wt 108.5 lb

## 2017-09-28 DIAGNOSIS — Z1211 Encounter for screening for malignant neoplasm of colon: Secondary | ICD-10-CM | POA: Diagnosis not present

## 2017-09-28 DIAGNOSIS — Z1159 Encounter for screening for other viral diseases: Secondary | ICD-10-CM

## 2017-09-28 DIAGNOSIS — J449 Chronic obstructive pulmonary disease, unspecified: Secondary | ICD-10-CM | POA: Diagnosis not present

## 2017-09-28 DIAGNOSIS — R0609 Other forms of dyspnea: Secondary | ICD-10-CM

## 2017-09-28 DIAGNOSIS — Z0000 Encounter for general adult medical examination without abnormal findings: Secondary | ICD-10-CM

## 2017-09-28 DIAGNOSIS — Z23 Encounter for immunization: Secondary | ICD-10-CM | POA: Diagnosis not present

## 2017-09-28 MED ORDER — PROAIR HFA 108 (90 BASE) MCG/ACT IN AERS: 2.0000 | INHALATION_SPRAY | RESPIRATORY_TRACT | 1 refills | Status: DC | PRN

## 2017-09-28 MED ORDER — TIOTROPIUM BROMIDE MONOHYDRATE 2.5 MCG/ACT IN AERS: 2.0000 | INHALATION_SPRAY | Freq: Every day | RESPIRATORY_TRACT | 3 refills | Status: DC

## 2017-09-28 MED ORDER — SYMBICORT 160-4.5 MCG/ACT IN AERO: 2.0000 | INHALATION_SPRAY | Freq: Two times a day (BID) | RESPIRATORY_TRACT | 1 refills | Status: DC

## 2017-09-28 MED ORDER — MONTELUKAST SODIUM 10 MG PO TABS: 10.0000 mg | ORAL_TABLET | Freq: Every day | ORAL | 3 refills | Status: DC

## 2017-09-28 NOTE — Assessment & Plan Note (Signed)
Doing well today; continue inhalers; will order PFTs at the hospital; she is not ready to see pulmonologist; check alpha-1 AT level

## 2017-09-28 NOTE — Progress Notes (Signed)
BP 136/86   Pulse 94   Temp (!) 97.5 F (36.4 C) (Oral)   Resp 14   Ht 5' (1.524 m)   Wt 108 lb 8 oz (49.2 kg)   SpO2 97%   BMI 21.19 kg/m    Subjective:    Patient ID: Brittney Doyle, female    DOB: 1960-09-30, 57 y.o.   MRN: [id number]  HPI: Brittney Doyle is a 57 y.o. female  Chief Complaint  Patient presents with  . Follow-up    HPI Patient is here for f/u Patient is new to me; previous provider left our practice  Her last visit here was on 09/16/2016, allergic rhinitis, her breathing gets worse with pollen and allergies She has COPD; she takes Symbicort and Spiriva; she has a rescue inhaler Using the rescue inhaler two to four times a day She got really sick a few years ago; she was okay for a few years, then got a few upper respiratory infections Today, her breathing is great Diagnosed with COPD She has not seen a pulmonologist yet, not mentally ready; diagnosed by Dr. Iven Finn Taking prednisone right now by the way; they put her on singulair too She is almost at the end of five day taper She uses ativan for travel only; do not drive for that trip; anxiety going to visit daughter, the highway part of it, not the daughter; she does have glass of wine occasionally; she knows to NOT take the ativan and alcohol with that  Depression screen [facility] 2/9 09/28/2017 09/16/2016 07/30/2015 07/21/2015 06/03/2015  Decreased Interest 0 0 0 0 0  Down, Depressed, Hopeless 0 0 0 0 0  PHQ - 2 Score 0 0 0 0 0   Relevant past medical, surgical, family and social history reviewed Past Medical History:  Diagnosis Date  . COPD (chronic obstructive pulmonary disease) ([place])   . Shortness of breath    Past Surgical History:  Procedure Laterality Date  . ABDOMINAL HYSTERECTOMY  1999   Family History  Problem Relation Age of Onset  . Heart attack Mother        Died of MI at age 11  . Heart attack Father        Died of MI at age 57  . Heart attack Brother    Social History   Tobacco Use    . Smoking status: Former Smoker    Packs/day: 0.75    Types: Cigarettes    Last attempt to quit: 04/19/2007    Years since quitting: 10.4  . Smokeless tobacco: Never Used  Substance Use Topics  . Alcohol use: Yes    Alcohol/week: 0.0 oz    Comment: occasional  . Drug use: No    Interim medical history since last visit reviewed. Allergies and medications reviewed  Review of Systems Per HPI unless specifically indicated above     Objective:    BP 136/86   Pulse 94   Temp (!) 97.5 F (36.4 C) (Oral)   Resp 14   Ht 5' (1.524 m)   Wt 108 lb 8 oz (49.2 kg)   SpO2 97%   BMI 21.19 kg/m   Wt Readings from Last 3 Encounters:  09/28/17 108 lb 8 oz (49.2 kg)  09/16/16 114 lb 9.6 oz (52 kg)  11/09/15 121 lb 4 oz (55 kg)    Physical Exam  Constitutional: She appears well-developed and well-nourished.  HENT:  Mouth/Throat: Mucous membranes are normal.  Eyes: EOM are normal. No scleral  icterus.  Cardiovascular: Normal rate and regular rhythm.  Pulmonary/Chest: Effort normal and breath sounds normal.  Psychiatric: She has a normal mood and affect. Her behavior is normal.       Assessment & Plan:   Problem List Items Addressed This Visit      Respiratory   COPD (chronic obstructive pulmonary disease) ([place]) - Primary    Doing well today; continue inhalers; will order PFTs at the hospital; she is not ready to see pulmonologist; check alpha-1 AT level      Relevant Medications   montelukast (SINGULAIR) 10 MG tablet   SYMBICORT 160-4.5 MCG/ACT inhaler   Tiotropium Bromide Monohydrate (SPIRIVA RESPIMAT) 2.5 MCG/ACT AERS   PROAIR HFA 108 (90 Base) MCG/ACT inhaler   Other Relevant Orders   Alpha-1-Antitrypsin Deficiency (Completed)   Pulmonary Function Test    Other Visit Diagnoses    Dyspnea on exertion       Screen for colon cancer       Relevant Orders   Cologuard   Need for 23-polyvalent pneumococcal polysaccharide vaccine       Relevant Orders   Pneumococcal  polysaccharide vaccine 23-valent greater than or equal to 2yo subcutaneous/IM (Completed)   Preventative health care       Relevant Orders   CBC with Differential/Platelet   Comprehensive metabolic panel   Lipid panel   TSH   Need for hepatitis C screening test       Relevant Orders   Hepatitis C antibody       Follow up plan: Return in about 2 months (around 11/28/2017) for complete physical.  An after-visit summary was printed and given to the patient at [place].  Please see the patient instructions which may contain other information and recommendations beyond what is mentioned above in the assessment and plan.  Meds ordered this encounter  Medications  . montelukast (SINGULAIR) 10 MG tablet    Sig: Take 1 tablet (10 mg total) by mouth at bedtime.    Dispense:  90 tablet    Refill:  3  . SYMBICORT 160-4.5 MCG/ACT inhaler    Sig: Inhale 2 puffs into the lungs 2 (two) times daily. USE 2 PUFFS BY MOUTH TWO  TIMES DAILY    Dispense:  3 Inhaler    Refill:  1  . Tiotropium Bromide Monohydrate (SPIRIVA RESPIMAT) 2.5 MCG/ACT AERS    Sig: Take 2 puffs by mouth daily.    Dispense:  3 Inhaler    Refill:  3  . PROAIR HFA 108 (90 Base) MCG/ACT inhaler    Sig: Inhale 2 puffs into the lungs every 4 (four) hours as needed for wheezing or shortness of breath.    Dispense:  3 Inhaler    Refill:  1    Orders Placed This Encounter  Procedures  . Pneumococcal polysaccharide vaccine 23-valent greater than or equal to 2yo subcutaneous/IM  . Cologuard  . Alpha-1-Antitrypsin Deficiency  . CBC with Differential/Platelet  . Comprehensive metabolic panel  . Lipid panel  . TSH  . Hepatitis C antibody  . Pulmonary Function Test  Face-to-face time with patient was more than 30 minutes, >50% time spent counseling and coordination of care

## 2017-09-28 NOTE — Patient Instructions (Addendum)
Try plain Allegra Continue inhalers Continue the singulair We'll get breathing tests Please have labs done If you have not heard anything from my staff in a week about any orders/referrals/studies from today, please contact us here to follow-up [phone] Return for a complete physical in one to two months You have received the Pneumovax vaccine (PPSV-23) and you will not need another booster of this until after you turn 65 per current ACIP guidelines

## 2017-09-29 LAB — COMPREHENSIVE METABOLIC PANEL
A/G RATIO: 1.5 (ref 1.2–2.2)
ALK PHOS: 94 IU/L (ref 39–117)
ALT: 26 IU/L (ref 0–32)
AST: 18 IU/L (ref 0–40)
Albumin: 4.3 g/dL (ref 3.5–5.5)
BUN/Creatinine Ratio: 24 — ABNORMAL HIGH (ref 9–23)
BUN: 15 mg/dL (ref 6–24)
Bilirubin Total: 0.4 mg/dL (ref 0.0–1.2)
CO2: 25 mmol/L (ref 20–29)
Calcium: 9.8 mg/dL (ref 8.7–10.2)
Chloride: 103 mmol/L (ref 96–106)
Creatinine, Ser: 0.63 mg/dL (ref 0.57–1.00)
GFR calc Af Amer: 116 mL/min/{1.73_m2} (ref >59)
GFR calc non Af Amer: 101 mL/min/{1.73_m2} (ref >59)
GLOBULIN, TOTAL: 2.8 g/dL (ref 1.5–4.5)
Glucose: 110 mg/dL — ABNORMAL HIGH (ref 65–99)
POTASSIUM: 4.4 mmol/L (ref 3.5–5.2)
SODIUM: 143 mmol/L (ref 134–144)
Total Protein: 7.1 g/dL (ref 6.0–8.5)

## 2017-09-29 LAB — CBC WITH DIFFERENTIAL/PLATELET
Basophils Absolute: 0 10*3/uL (ref 0.0–0.2)
Basos: 0 %
EOS (ABSOLUTE): 0 10*3/uL (ref 0.0–0.4)
EOS: 0 %
HEMATOCRIT: 44.5 % (ref 34.0–46.6)
Hemoglobin: 15.4 g/dL (ref 11.1–15.9)
Immature Grans (Abs): 0 10*3/uL (ref 0.0–0.1)
Immature Granulocytes: 0 %
LYMPHS ABS: 1.1 10*3/uL (ref 0.7–3.1)
Lymphs: 16 %
MCH: 34.8 pg — ABNORMAL HIGH (ref 26.6–33.0)
MCHC: 34.6 g/dL (ref 31.5–35.7)
MCV: 101 fL — AB (ref 79–97)
MONOS ABS: 0.5 10*3/uL (ref 0.1–0.9)
Monocytes: 6 %
Neutrophils Absolute: 5.6 10*3/uL (ref 1.4–7.0)
Neutrophils: 78 %
PLATELETS: 297 10*3/uL (ref 150–450)
RBC: 4.43 x10E6/uL (ref 3.77–5.28)
RDW: 13.3 % (ref 12.3–15.4)
WBC: 7.2 10*3/uL (ref 3.4–10.8)

## 2017-09-29 LAB — HEPATITIS C ANTIBODY

## 2017-09-29 LAB — TSH: TSH: 1.63 u[IU]/mL (ref 0.450–4.500)

## 2017-10-04 ENCOUNTER — Encounter: Payer: Self-pay | Admitting: Family Medicine

## 2017-10-04 ENCOUNTER — Other Ambulatory Visit: Payer: Self-pay | Admitting: Family Medicine

## 2017-10-04 DIAGNOSIS — D7589 Other specified diseases of blood and blood-forming organs: Secondary | ICD-10-CM | POA: Insufficient documentation

## 2017-10-04 NOTE — Progress Notes (Signed)
Orders entered

## 2017-10-06 LAB — ALPHA-1-ANTITRYPSIN DEFICIENCY

## 2017-10-31 ENCOUNTER — Telehealth: Payer: Self-pay | Admitting: Family Medicine

## 2017-10-31 DIAGNOSIS — F418 Other specified anxiety disorders: Secondary | ICD-10-CM

## 2017-10-31 MED ORDER — LORAZEPAM 1 MG PO TABS: 0.5000 mg | ORAL_TABLET | Freq: Every day | ORAL | 0 refills | Status: DC | PRN

## 2017-10-31 NOTE — Telephone Encounter (Signed)
PMP Aware reviewed; no red flags Okay for Rx; sent via [facility]

## 2017-10-31 NOTE — Telephone Encounter (Signed)
Copied from [place]. Topic: Quick Communication - Rx Refill/Question >> Oct 31, 2017 10:59 AM Burchel, Abbi R wrote: Medication: LORazepam (ATIVAN) 1 MG tablet   Preferred Pharmacy: [id number] - [place], [place] (Phone) [phone] (Fax)     Pt: (c) [phone] (w) [phone]   Pt is going on vacation and only uses this medication during travel time.  She is going out of town on Friday  Pt was advised that RX refills may take up to 3 business days.

## 2017-11-30 ENCOUNTER — Encounter: Payer: Self-pay | Admitting: Family Medicine

## 2017-12-08 ENCOUNTER — Encounter: Payer: Self-pay | Admitting: Nurse Practitioner

## 2017-12-08 ENCOUNTER — Encounter: Payer: Self-pay | Admitting: Family Medicine

## 2017-12-08 ENCOUNTER — Ambulatory Visit: Payer: Managed Care, Other (non HMO) | Admitting: Nurse Practitioner

## 2017-12-08 VITALS — BP 110/70 | HR 100 | Temp 97.7°F | Resp 12 | Ht 60.0 in | Wt 108.3 lb

## 2017-12-08 DIAGNOSIS — E785 Hyperlipidemia, unspecified: Secondary | ICD-10-CM | POA: Insufficient documentation

## 2017-12-08 DIAGNOSIS — R0602 Shortness of breath: Secondary | ICD-10-CM

## 2017-12-08 DIAGNOSIS — Z1211 Encounter for screening for malignant neoplasm of colon: Secondary | ICD-10-CM | POA: Diagnosis not present

## 2017-12-08 DIAGNOSIS — J441 Chronic obstructive pulmonary disease with (acute) exacerbation: Secondary | ICD-10-CM | POA: Diagnosis not present

## 2017-12-08 DIAGNOSIS — R748 Abnormal levels of other serum enzymes: Secondary | ICD-10-CM | POA: Insufficient documentation

## 2017-12-08 MED ORDER — PREDNISONE 10 MG (48) PO TBPK: ORAL_TABLET | ORAL | 0 refills | Status: DC

## 2017-12-08 NOTE — Progress Notes (Signed)
Name: Brittney Doyle   MRN: [id number]    DOB: Sep 13, 1960   Date:12/08/2017       Progress Note  Subjective  Chief Complaint  Chief Complaint  Patient presents with  . Breathing Problem    She has been dx with asthma and copd. She has had congestion and dry cough that comes and goes x 2 weeks. She has sob with exertion. She has been using her inhalers as prescribed.     HPI  Has COPD and Asthma and allergies presents with shortness of breath.   Noticed more phlegm and mucous in her throat over the past several months. Shortness of breath is worse with exertion over the past 2 weeks- feels she has a lot of congestion in chest.  Quit smoking a long time ago. States felt feverish 2 days took tylenol and it resolved hasn't had fever feeling since. No weakness, lethargy, sore throat, sinus pressure, ear pain. States was carrying some bags to the car and then rushed in to make appointment here and was anxious about being later for physical and got really winded had to use albuterol inhaler. Last COPD exacerbation was in 2017.   Patient Active Problem List   Diagnosis Date Noted  . HLD (hyperlipidemia) 12/08/2017  . Abnormal liver enzymes 12/08/2017  . Macrocytosis 10/04/2017  . Situational anxiety 11/09/2015  . Acute bronchitis 07/30/2015  . Seasonal allergic rhinitis 07/30/2015  . COPD (chronic obstructive pulmonary disease) ([place]) 06/03/2015    Past Medical History:  Diagnosis Date  . COPD (chronic obstructive pulmonary disease) ([place])   . Shortness of breath     Past Surgical History:  Procedure Laterality Date  . ABDOMINAL HYSTERECTOMY  1999    Social History   Tobacco Use  . Smoking status: Former Smoker    Packs/day: 0.75    Types: Cigarettes    Last attempt to quit: 04/19/2007    Years since quitting: 10.6  . Smokeless tobacco: Never Used  Substance Use Topics  . Alcohol use: Yes    Alcohol/week: 0.0 standard drinks    Comment: occasional     Current Outpatient  Medications:  .  fexofenadine-pseudoephedrine (ALLEGRA-D 24) 180-240 MG 24 hr tablet, Take 1 tablet by mouth daily., Disp: , Rfl:  .  fluticasone (FLONASE) 50 MCG/ACT nasal spray, Place 2 sprays into both nostrils daily., Disp: 48 g, Rfl: 1 .  montelukast (SINGULAIR) 10 MG tablet, Take 1 tablet (10 mg total) by mouth at bedtime., Disp: 90 tablet, Rfl: 3 .  PROAIR HFA 108 (90 Base) MCG/ACT inhaler, Inhale 2 puffs into the lungs every 4 (four) hours as needed for wheezing or shortness of breath., Disp: 3 Inhaler, Rfl: 1 .  SYMBICORT 160-4.5 MCG/ACT inhaler, Inhale 2 puffs into the lungs 2 (two) times daily. USE 2 PUFFS BY MOUTH TWO  TIMES DAILY, Disp: 3 Inhaler, Rfl: 1 .  Tiotropium Bromide Monohydrate (SPIRIVA RESPIMAT) 2.5 MCG/ACT AERS, Take 2 puffs by mouth daily., Disp: 3 Inhaler, Rfl: 3  No Known Allergies  ROS  No other specific complaints in a complete review of systems (except as listed in HPI above).  Objective  Vitals:   12/08/17 1458  BP: 110/70  Pulse: 100  Resp: 12  Temp: 97.7 F (36.5 C)  TempSrc: Oral  SpO2: 100%  Weight: 108 lb 4.8 oz (49.1 kg)  Height: 5' (1.524 m)    Body mass index is 21.15 kg/m.  Nursing Note and Vital Signs reviewed.  Physical Exam  Constitutional: She  appears well-developed and well-nourished. She is cooperative.  HENT:  Head: Normocephalic.  Right Ear: Hearing normal.  Left Ear: Hearing normal.  Nose: Nose normal.  Mouth/Throat: Oropharynx is clear and moist and mucous membranes are normal.  Cardiovascular: Regular rhythm and normal heart sounds.  Mildly elevated rate  Pulmonary/Chest: Effort normal. No stridor. No respiratory distress. She has no wheezes. She exhibits no tenderness.  Adventitious breath sound heard bilaterally throughout.   Abdominal: Normal appearance.  Musculoskeletal: Normal range of motion. She exhibits no edema or deformity.  Neurological: She is alert.  Skin: Skin is warm and dry. Capillary refill takes  less than 2 seconds.  Psychiatric: She has a normal mood and affect. Her speech is normal and behavior is normal. Judgment and thought content normal.       No results found for this or any previous visit (from the past 48 hour(s)).  Assessment & Plan  1. Shortness of breath Crackle like sound heard bilaterally throughout- discussed getting chest x-ray to rule out infectious process- patient not dyspneic, afebrile, tachycardia likely due to albuterol, presently states has been told she always has that sound and does not think she has infectious process. Discussed risk and benefits and informed order placed- discussed ER precautions. Will call back on Monday to inform us of condition. - DG Chest 2 View; Future - predniSONE (STERAPRED UNI-PAK 48 TAB) 10 MG (48) TBPK tablet; Take as directed  Dispense: 48 tablet; Refill: 0  2. COPD with acute exacerbation (HCC) - predniSONE (STERAPRED UNI-PAK 48 TAB) 10 MG (48) TBPK tablet; Take as directed  Dispense: 48 tablet; Refill: 0  3. Screening for colon cancer - Cologuard   -Red flags and when to present for emergency care or RTC including fever >101.48F, chest pain, shortness of breath, new/worsening/un-resolving symptoms,  reviewed with patient at time of visit. Follow up and care instructions discussed and provided in AVS. -Reviewed Health Maintenance: discussed

## 2017-12-08 NOTE — Patient Instructions (Signed)
-   Take the steroid dose pack as instructed - Drink plenty of water ( 64 ounces is the daily recommendations) - Chest xray is ordered you can go to the [facility] across the street to get it done.  - Call us on Monday to let us know either way if you are feeling better or worse.   Chronic Obstructive Pulmonary Disease Exacerbation Chronic obstructive pulmonary disease (COPD) is a common lung problem. In COPD, the flow of air from the lungs is limited. COPD exacerbations are times that breathing gets worse and you need extra treatment. Without treatment they can be life threatening. If they happen often, your lungs can become more damaged. If your COPD gets worse, your doctor may treat you with:  Medicines.  Oxygen.  Different ways to clear your airway, such as using a mask.  Follow these instructions at home:  Do not smoke.  Avoid tobacco smoke and other things that bother your lungs.  If given, take your antibiotic medicine as told. Finish the medicine even if you start to feel better.  Only take medicines as told by your doctor.  Drink enough fluids to keep your pee (urine) clear or pale yellow (unless your doctor has told you not to).  Use a cool mist machine (vaporizer).  If you use oxygen or a machine that turns liquid medicine into a mist (nebulizer), continue to use them as told.  Keep up with shots (vaccinations) as told by your doctor.  Exercise regularly.  Eat healthy foods.  Keep all doctor visits as told. Get help right away if:  You are very short of breath and it gets worse.  You have trouble talking.  You have bad chest pain.  You have blood in your spit (sputum).  You have a fever.  You keep throwing up (vomiting).  You feel weak, or you pass out (faint).  You feel confused.  You keep getting worse. This information is not intended to replace advice given to you by your health care provider. Make sure you discuss any questions you  have with your health care provider. Document Released: 03/24/2011 Document Revised: 09/10/2015 Document Reviewed: 12/07/2012 Elsevier Interactive Patient Education  2017 [employer].

## 2017-12-12 ENCOUNTER — Telehealth: Payer: Self-pay | Admitting: Nurse Practitioner

## 2017-12-12 DIAGNOSIS — J209 Acute bronchitis, unspecified: Secondary | ICD-10-CM

## 2017-12-12 DIAGNOSIS — J449 Chronic obstructive pulmonary disease, unspecified: Secondary | ICD-10-CM

## 2017-12-12 NOTE — Telephone Encounter (Signed)
Copied from [place] [id number]. Topic: General - Other >> Dec 12, 2017  1:33 PM Margot Ables wrote: Reason for CRM: pt started prednisone Saturday and has about 3 full days of medication in. She states she is 35% better. Pt states she is interested in getting an at home nebulizer machine with solution to do treatments at home. Please advise if that can be ordered.

## 2017-12-13 ENCOUNTER — Other Ambulatory Visit: Payer: Self-pay

## 2017-12-13 DIAGNOSIS — J449 Chronic obstructive pulmonary disease, unspecified: Secondary | ICD-10-CM

## 2017-12-13 MED ORDER — ALBUTEROL SULFATE (2.5 MG/3ML) 0.083% IN NEBU: 2.5000 mg | INHALATION_SOLUTION | Freq: Four times a day (QID) | RESPIRATORY_TRACT | 0 refills | Status: DC | PRN

## 2017-12-13 NOTE — Telephone Encounter (Signed)
We can order a nebulizer machine and medication but this may not be covered by insurance and typically works similarly to the inhaler. If you do still want the nebulizer machine, I can send the order, but be aware that this is the same medication as the albuterol inhaler and you should not use both. You will continue to use symbicort and spiriva.

## 2017-12-13 NOTE — Telephone Encounter (Signed)
Orders completed. Patient needs to get PFTs done prior to refills. Voicemail left.

## 2017-12-13 NOTE — Telephone Encounter (Signed)
She prefers nebulizer machine: Please print so I can send to [facility] and medication sent to [facility]

## 2017-12-13 NOTE — Addendum Note (Signed)
Addended by: Fredderick Severance on: 12/13/2017 02:40 PM   Modules accepted: Orders

## 2017-12-15 ENCOUNTER — Other Ambulatory Visit: Payer: Self-pay | Admitting: Family Medicine

## 2017-12-15 DIAGNOSIS — J449 Chronic obstructive pulmonary disease, unspecified: Secondary | ICD-10-CM

## 2018-01-05 LAB — COLOGUARD: Cologuard: POSITIVE

## 2018-01-10 ENCOUNTER — Telehealth: Payer: Self-pay | Admitting: Nurse Practitioner

## 2018-01-10 ENCOUNTER — Other Ambulatory Visit: Payer: Self-pay | Admitting: Nurse Practitioner

## 2018-01-10 DIAGNOSIS — Z1211 Encounter for screening for malignant neoplasm of colon: Secondary | ICD-10-CM

## 2018-01-10 DIAGNOSIS — R195 Other fecal abnormalities: Secondary | ICD-10-CM

## 2018-01-10 DIAGNOSIS — Z1212 Encounter for screening for malignant neoplasm of rectum: Principal | ICD-10-CM

## 2018-01-10 NOTE — Telephone Encounter (Signed)
Attempted to call patient twice without response. Please try to reach out and if she calls back you can forward to me if available or review the following.  _____ Inform her of positive cologuard test. 10-13% chance of false positive. Recommended colonoscopy for further testing. Order placed, should receive phone call for scheduling.

## 2018-01-10 NOTE — Progress Notes (Signed)
Patient has positive cologuard.

## 2018-01-11 ENCOUNTER — Telehealth: Payer: Self-pay | Admitting: Nurse Practitioner

## 2018-01-11 NOTE — Telephone Encounter (Signed)
Patient called. Patient is aware.

## 2018-01-11 NOTE — Telephone Encounter (Signed)
Called patient to inform her of positive cologuard test and referral for colonoscopy. Questions and concerns addressed.

## 2018-01-12 ENCOUNTER — Other Ambulatory Visit: Payer: Self-pay

## 2018-01-12 DIAGNOSIS — R195 Other fecal abnormalities: Secondary | ICD-10-CM

## 2018-01-12 MED ORDER — NA SULFATE-K SULFATE-MG SULF 17.5-3.13-1.6 GM/177ML PO SOLN: 1.0000 | Freq: Once | ORAL | 0 refills | Status: AC

## 2018-01-25 ENCOUNTER — Ambulatory Visit (INDEPENDENT_AMBULATORY_CARE_PROVIDER_SITE_OTHER): Payer: Managed Care, Other (non HMO) | Admitting: Family Medicine

## 2018-01-25 ENCOUNTER — Encounter: Payer: Self-pay | Admitting: *Deleted

## 2018-01-25 ENCOUNTER — Encounter: Payer: Self-pay | Admitting: Family Medicine

## 2018-01-25 VITALS — Ht 60.0 in | Wt 108.0 lb

## 2018-01-25 DIAGNOSIS — Z23 Encounter for immunization: Secondary | ICD-10-CM | POA: Diagnosis not present

## 2018-01-25 DIAGNOSIS — D7589 Other specified diseases of blood and blood-forming organs: Secondary | ICD-10-CM

## 2018-01-25 DIAGNOSIS — E2839 Other primary ovarian failure: Secondary | ICD-10-CM | POA: Diagnosis not present

## 2018-01-25 DIAGNOSIS — D692 Other nonthrombocytopenic purpura: Secondary | ICD-10-CM

## 2018-01-25 DIAGNOSIS — Z0000 Encounter for general adult medical examination without abnormal findings: Secondary | ICD-10-CM | POA: Insufficient documentation

## 2018-01-25 NOTE — Progress Notes (Signed)
Patient ID: HAYDN CUSH, female   DOB: Mar 01, 1961, 57 y.o.   MRN: [id number]   Subjective:   Brittney Doyle is a 57 y.o. female here for a complete physical exam  Interim issues since last visit: no medical excitement  USPSTF grade A and B recommendations Depression:  Depression screen [facility] 2/9 01/25/2018 12/08/2017 09/28/2017 09/16/2016 07/30/2015  Decreased Interest 0 0 0 0 0  Down, Depressed, Hopeless 0 0 0 0 0  PHQ - 2 Score 0 0 0 0 0  Altered sleeping 0 - - - -  Tired, decreased energy 0 - - - -  Change in appetite 0 - - - -  Feeling bad or failure about yourself  0 - - - -  Trouble concentrating 0 - - - -  Moving slowly or fidgety/restless 0 - - - -  Suicidal thoughts 0 - - - -  PHQ-9 Score 0 - - - -  Difficult doing work/chores Not difficult at all - - - -   Hypertension: BP Readings from Last 3 Encounters:  01/26/18 120/76  12/08/17 110/70  09/28/17 136/86   Obesity: Wt Readings from Last 3 Encounters:  01/26/18 108 lb (49 kg)  12/08/17 108 lb 4.8 oz (49.1 kg)  09/28/17 108 lb 8 oz (49.2 kg)   BMI Readings from Last 3 Encounters:  01/26/18 21.09 kg/m  12/08/17 21.15 kg/m  09/28/17 21.19 kg/m     Skin cancer: nothing worrisome Lung cancer:  Quit 10 years ago; did not smoke 30 pack years Breast cancer: no lumps; UTD mammo Colorectal cancer: tomorrow Cervical cancer screening: s/p hyst for benign reasons BRCA gene screening: family hx of breast and/or ovarian cancer and/or metastatic prostate cancer? No  HIV, hep B, hep C: no need STD testing and prevention (chl/gon/syphilis): no need Intimate partner violence: no abuse Contraception: n/a Osteoporosis: ex-smoker, white, thin, ordering DEXA today Fall prevention/vitamin D: discussed; time outdoors on weekends Immunizations: discussed Diet: good eater; no red meat, no chicken either; pescatarian; started b12 supplement Exercise: not very active; she will try to build up walking at work; tired at  work Alcohol:    Office Visit from 01/25/2018 in [facility]  AUDIT-C Score  4    drinks one glass of wine every night, that is down from before and no problems  Tobacco use: quit AAA: n/a Aspirin: one cholesterol level was 10 points above normal Glucose: 96 fasting Glucose  Date Value Ref Range Status  09/28/2017 110 (H) 65 - 99 mg/dL Final   Glucose, Bld  Date Value Ref Range Status  07/21/2015 110 (H) 65 - 99 mg/dL Final   Lipids:  No results found for: CHOL No results found for: HDL No results found for: LDLCALC No results found for: TRIG No results found for: CHOLHDL No results found for: LDLDIRECT   Past Medical History:  Diagnosis Date  . COPD (chronic obstructive pulmonary disease) ([place])   . Shortness of breath    Past Surgical History:  Procedure Laterality Date  . ABDOMINAL HYSTERECTOMY  1999  . COLONOSCOPY WITH PROPOFOL N/A 01/26/2018   Procedure: COLONOSCOPY WITH PROPOFOL;  Surgeon: Jonathon Bellows, MD;  Location: [facility] ENDOSCOPY;  Service: Gastroenterology;  Laterality: N/A;   Family History  Problem Relation Age of Onset  . Heart attack Mother        Died of MI at age 30  . Heart attack Father        Died of MI at age 59  .  Heart attack Brother    Social History   Tobacco Use  . Smoking status: Former Smoker    Packs/day: 0.75    Types: Cigarettes    Last attempt to quit: 04/19/2007    Years since quitting: 10.7  . Smokeless tobacco: Never Used  Substance Use Topics  . Alcohol use: Yes    Alcohol/week: 0.0 standard drinks    Comment: occasional  . Drug use: No   Review of Systems  Constitutional: Positive for unexpected weight change (dropped a little bit over the prep colonoscopy).  HENT: Negative for hearing loss.   Eyes: Negative for visual disturbance.       No dry eyes  Respiratory: Positive for wheezing (few and far between; on inhalers).   Cardiovascular: Negative for chest pain.  Gastrointestinal: Positive for  blood in stool (just a few times; colonoscopy tomorrow).  Endocrine: Positive for polydipsia (dry mouth).  Genitourinary: Negative for dysuria.  Musculoskeletal: Negative for arthralgias.  Skin:       Easily torn skin and bruising  Allergic/Immunologic: Negative for food allergies.  Neurological: Negative for tremors.  Hematological: Bruises/bleeds easily (bruises easily, skin tears easily; steroids).  Psychiatric/Behavioral: Negative for confusion and dysphoric mood.    Objective:   There were no vitals filed for this visit. There is no height or weight on file to calculate BMI. Wt Readings from Last 3 Encounters:  01/26/18 108 lb (49 kg)  12/08/17 108 lb 4.8 oz (49.1 kg)  09/28/17 108 lb 8 oz (49.2 kg)   Physical Exam  Constitutional: She appears well-developed and well-nourished.  HENT:  Head: Normocephalic and atraumatic.  Right Ear: Hearing, tympanic membrane, external ear and ear canal normal.  Left Ear: Hearing, tympanic membrane, external ear and ear canal normal.  Eyes: Conjunctivae and EOM are normal. Right eye exhibits no hordeolum. Left eye exhibits no hordeolum. No scleral icterus.  Neck: Carotid bruit is not present. No thyromegaly present.  Cardiovascular: Normal rate, regular rhythm, S1 normal, S2 normal and normal heart sounds.  No extrasystoles are present.  Pulmonary/Chest: Effort normal and breath sounds normal. No respiratory distress. Right breast exhibits no inverted nipple, no mass, no nipple discharge, no skin change and no tenderness. Left breast exhibits no inverted nipple, no mass, no nipple discharge, no skin change and no tenderness. Breasts are symmetrical.  Abdominal: Soft. Normal appearance and bowel sounds are normal. She exhibits no distension, no abdominal bruit, no pulsatile midline mass and no mass. There is no hepatosplenomegaly. There is no tenderness. No hernia.  Musculoskeletal: Normal range of motion. She exhibits no edema.  Lymphadenopathy:        Head (right side): No submandibular adenopathy present.       Head (left side): No submandibular adenopathy present.    She has no cervical adenopathy.    She has no axillary adenopathy.  Neurological: She is alert. She displays no tremor. No cranial nerve deficit. She exhibits normal muscle tone. Gait normal.  Reflex Scores:      Patellar reflexes are 2+ on the right side and 2+ on the left side. Skin: Skin is warm and dry. Ecchymosis (extensor surfaces of arms) noted. No cyanosis. No pallor.  Psychiatric: Her speech is normal and behavior is normal. Thought content normal. Her mood appears not anxious. She does not exhibit a depressed mood.    Assessment/Plan:   Problem List Items Addressed This Visit      Other   Macrocytosis   Relevant Orders   Vitamin  B12   Folate   CBC with Differential/Platelet   Preventative health care - Primary    USPSTF grade A and B recommendations reviewed with patient; age-appropriate recommendations, preventive care, screening tests, etc discussed and encouraged; healthy living encouraged; see AVS for patient education given to patient        Other Visit Diagnoses    Need for influenza vaccination       Relevant Orders   Flu Vaccine QUAD 6+ mos PF IM (Fluarix Quad PF) (Completed)   Ovarian failure       Relevant Orders   DG Bone Density   Purpura, nonthrombocytopenic ([place])       Relevant Orders   CBC with Differential/Platelet   PT and PTT       No orders of the defined types were placed in this encounter.  Orders Placed This Encounter  Procedures  . DG Bone Density    Standing Status:   Future    Standing Expiration Date:   07/27/2018    Order Specific Question:   Reason for Exam (SYMPTOM  OR DIAGNOSIS REQUIRED)    Answer:   ovarian failure    Order Specific Question:   Preferred imaging location?    Answer:   [facility]    Order Specific Question:   Is the patient pregnant?    Answer:   No  . Flu Vaccine QUAD 6+ mos  PF IM (Fluarix Quad PF)  . Vitamin B12  . Folate  . CBC with Differential/Platelet  . PT and PTT    Follow up plan: Return in about 1 year (around 01/26/2019) for complete physical.  An After Visit Summary was printed and given to the patient.

## 2018-01-25 NOTE — Patient Instructions (Addendum)
Please do call to schedule your bone density study; the number to schedule one at either [facility] or [facility] is [phone] or [phone] Vitamin D 800 to 1000 iu vitamin D3 daily on days when not getting much sun  Consider getting the new shingles vaccine called Shingrix; that is available for individuals 57 years of age and older, and is recommended even if you have had shingles in the past and/or already received the old shingles vaccine (Zostavax); it is a two-part series, and is available at many local pharmacies  Try to use PLAIN allergy medicine without the decongestant Avoid: phenylephrine, phenylpropanolamine, and pseudoephredine  Please have the labs done at your convenience If you have not heard anything from my staff in a week about any orders/referrals/studies from today, please contact us here to follow-up [phone]  Health Maintenance, Female Adopting a healthy lifestyle and getting preventive care can go a long way to promote health and wellness. Talk with your health care provider about what schedule of regular examinations is right for you. This is a good chance for you to check in with your provider about disease prevention and staying healthy. In between checkups, there are plenty of things you can do on your own. Experts have done a lot of research about which lifestyle changes and preventive measures are most likely to keep you healthy. Ask your health care provider for more information. Weight and diet Eat a healthy diet  Be sure to include plenty of vegetables, fruits, low-fat dairy products, and lean protein.  Do not eat a lot of foods high in solid fats, added sugars, or salt.  Get regular exercise. This is one of the most important things you can do for your health. ? Most adults should exercise for at least 150 minutes each week. The exercise should increase your heart rate and make you sweat (moderate-intensity  exercise). ? Most adults should also do strengthening exercises at least twice a week. This is in addition to the moderate-intensity exercise.  Maintain a healthy weight  Body mass index (BMI) is a measurement that can be used to identify possible weight problems. It estimates body fat based on height and weight. Your health care provider can help determine your BMI and help you achieve or maintain a healthy weight.  For females 20 years of age and older: ? A BMI below 18.5 is considered underweight. ? A BMI of 18.5 to 24.9 is normal. ? A BMI of 25 to 29.9 is considered overweight. ? A BMI of 30 and above is considered obese.  Watch levels of cholesterol and blood lipids  You should start having your blood tested for lipids and cholesterol at 57 years of age, then have this test every 5 years.  You may need to have your cholesterol levels checked more often if: ? Your lipid or cholesterol levels are high. ? You are older than 57 years of age. ? You are at high risk for heart disease.  Cancer screening Lung Cancer  Lung cancer screening is recommended for adults 55-80 years old who are at high risk for lung cancer because of a history of smoking.  A yearly low-dose CT scan of the lungs is recommended for people who: ? Currently smoke. ? Have quit within the past 15 years. ? Have at least a 30-pack-year history of smoking. A pack year is smoking an average of one pack of cigarettes a day for 1 year.  Yearly screening should   continue until it has been 15 years since you quit.  Yearly screening should stop if you develop a health problem that would prevent you from having lung cancer treatment.  Breast Cancer  Practice breast self-awareness. This means understanding how your breasts normally appear and feel.  It also means doing regular breast self-exams. Let your health care provider know about any changes, no matter how small.  If you are in your 20s or 30s, you should have a  clinical breast exam (CBE) by a health care provider every 1-3 years as part of a regular health exam.  If you are 81 or older, have a CBE every year. Also consider having a breast X-ray (mammogram) every year.  If you have a family history of breast cancer, talk to your health care provider about genetic screening.  If you are at high risk for breast cancer, talk to your health care provider about having an MRI and a mammogram every year.  Breast cancer gene (BRCA) assessment is recommended for women who have family members with BRCA-related cancers. BRCA-related cancers include: ? Breast. ? Ovarian. ? Tubal. ? Peritoneal cancers.  Results of the assessment will determine the need for genetic counseling and BRCA1 and BRCA2 testing.  Cervical Cancer Your health care provider may recommend that you be screened regularly for cancer of the pelvic organs (ovaries, uterus, and vagina). This screening involves a pelvic examination, including checking for microscopic changes to the surface of your cervix (Pap test). You may be encouraged to have this screening done every 3 years, beginning at age 13.  For women ages 86-65, health care providers may recommend pelvic exams and Pap testing every 3 years, or they may recommend the Pap and pelvic exam, combined with testing for human papilloma virus (HPV), every 5 years. Some types of HPV increase your risk of cervical cancer. Testing for HPV may also be done on women of any age with unclear Pap test results.  Other health care providers may not recommend any screening for nonpregnant women who are considered low risk for pelvic cancer and who do not have symptoms. Ask your health care provider if a screening pelvic exam is right for you.  If you have had past treatment for cervical cancer or a condition that could lead to cancer, you need Pap tests and screening for cancer for at least 20 years after your treatment. If Pap tests have been discontinued,  your risk factors (such as having a new sexual partner) need to be reassessed to determine if screening should resume. Some women have medical problems that increase the chance of getting cervical cancer. In these cases, your health care provider may recommend more frequent screening and Pap tests.  Colorectal Cancer  This type of cancer can be detected and often prevented.  Routine colorectal cancer screening usually begins at 57 years of age and continues through 57 years of age.  Your health care provider may recommend screening at an earlier age if you have risk factors for colon cancer.  Your health care provider may also recommend using home test kits to check for hidden blood in the stool.  A small camera at the end of a tube can be used to examine your colon directly (sigmoidoscopy or colonoscopy). This is done to check for the earliest forms of colorectal cancer.  Routine screening usually begins at age 22.  Direct examination of the colon should be repeated every 5-10 years through 57 years of age. However, you may  need to be screened more often if early forms of precancerous polyps or small growths are found.  Skin Cancer  Check your skin from head to toe regularly.  Tell your health care provider about any new moles or changes in moles, especially if there is a change in a mole's shape or color.  Also tell your health care provider if you have a mole that is larger than the size of a pencil eraser.  Always use sunscreen. Apply sunscreen liberally and repeatedly throughout the day.  Protect yourself by wearing long sleeves, pants, a wide-brimmed hat, and sunglasses whenever you are outside.  Heart disease, diabetes, and high blood pressure  High blood pressure causes heart disease and increases the risk of stroke. High blood pressure is more likely to develop in: ? People who have blood pressure in the high end of the normal range (130-139/85-89 mm Hg). ? People who are  overweight or obese. ? People who are African American.  If you are 53-74 years of age, have your blood pressure checked every 3-5 years. If you are 68 years of age or older, have your blood pressure checked every year. You should have your blood pressure measured twice-once when you are at a hospital or clinic, and once when you are not at a hospital or clinic. Record the average of the two measurements. To check your blood pressure when you are not at a hospital or clinic, you can use: ? An automated blood pressure machine at a pharmacy. ? A home blood pressure monitor.  If you are between 22 years and 16 years old, ask your health care provider if you should take aspirin to prevent strokes.  Have regular diabetes screenings. This involves taking a blood sample to check your fasting blood sugar level. ? If you are at a normal weight and have a low risk for diabetes, have this test once every three years after 57 years of age. ? If you are overweight and have a high risk for diabetes, consider being tested at a younger age or more often. Preventing infection Hepatitis B  If you have a higher risk for hepatitis B, you should be screened for this virus. You are considered at high risk for hepatitis B if: ? You were born in a country where hepatitis B is common. Ask your health care provider which countries are considered high risk. ? Your parents were born in a high-risk country, and you have not been immunized against hepatitis B (hepatitis B vaccine). ? You have HIV or AIDS. ? You use needles to inject street drugs. ? You live with someone who has hepatitis B. ? You have had sex with someone who has hepatitis B. ? You get hemodialysis treatment. ? You take certain medicines for conditions, including cancer, organ transplantation, and autoimmune conditions.  Hepatitis C  Blood testing is recommended for: ? Everyone born from [birth year] through 1965. ? Anyone with known risk factors for  hepatitis C.  Sexually transmitted infections (STIs)  You should be screened for sexually transmitted infections (STIs) including gonorrhea and chlamydia if: ? You are sexually active and are younger than 57 years of age. ? You are older than 57 years of age and your health care provider tells you that you are at risk for this type of infection. ? Your sexual activity has changed since you were last screened and you are at an increased risk for chlamydia or gonorrhea. Ask your health care provider if you are at  risk.  If you do not have HIV, but are at risk, it may be recommended that you take a prescription medicine daily to prevent HIV infection. This is called pre-exposure prophylaxis (PrEP). You are considered at risk if: ? You are sexually active and do not regularly use condoms or know the HIV status of your partner(s). ? You take drugs by injection. ? You are sexually active with a partner who has HIV.  Talk with your health care provider about whether you are at high risk of being infected with HIV. If you choose to begin PrEP, you should first be tested for HIV. You should then be tested every 3 months for as long as you are taking PrEP. Pregnancy  If you are premenopausal and you may become pregnant, ask your health care provider about preconception counseling.  If you may become pregnant, take 400 to 800 micrograms (mcg) of folic acid every day.  If you want to prevent pregnancy, talk to your health care provider about birth control (contraception). Osteoporosis and menopause  Osteoporosis is a disease in which the bones lose minerals and strength with aging. This can result in serious bone fractures. Your risk for osteoporosis can be identified using a bone density scan.  If you are 75 years of age or older, or if you are at risk for osteoporosis and fractures, ask your health care provider if you should be screened.  Ask your health care provider whether you should take a  calcium or vitamin D supplement to lower your risk for osteoporosis.  Menopause may have certain physical symptoms and risks.  Hormone replacement therapy may reduce some of these symptoms and risks. Talk to your health care provider about whether hormone replacement therapy is right for you. Follow these instructions at home:  Schedule regular health, dental, and eye exams.  Stay current with your immunizations.  Do not use any tobacco products including cigarettes, chewing tobacco, or electronic cigarettes.  If you are pregnant, do not drink alcohol.  If you are breastfeeding, limit how much and how often you drink alcohol.  Limit alcohol intake to no more than 1 drink per day for nonpregnant women. One drink equals 12 ounces of beer, 5 ounces of wine, or 1 ounces of hard liquor.  Do not use street drugs.  Do not share needles.  Ask your health care provider for help if you need support or information about quitting drugs.  Tell your health care provider if you often feel depressed.  Tell your health care provider if you have ever been abused or do not feel safe at home. This information is not intended to replace advice given to you by your health care provider. Make sure you discuss any questions you have with your health care provider. Document Released: 10/18/2010 Document Revised: 09/10/2015 Document Reviewed: 01/06/2015 Elsevier Interactive Patient Education  [employer].

## 2018-01-26 ENCOUNTER — Encounter: Payer: Self-pay | Admitting: *Deleted

## 2018-01-26 ENCOUNTER — Ambulatory Visit
Admission: RE | Admit: 2018-01-26 | Discharge: 2018-01-26 | Disposition: A | Discharge status: Home / Self Care | Payer: Managed Care, Other (non HMO) | Source: Ambulatory Visit | Attending: Gastroenterology | Admitting: Gastroenterology

## 2018-01-26 ENCOUNTER — Encounter
Admission: RE | Disposition: A | Discharge status: Home / Self Care | Payer: Self-pay | Source: Ambulatory Visit | Attending: Gastroenterology

## 2018-01-26 ENCOUNTER — Ambulatory Visit: Payer: Managed Care, Other (non HMO) | Admitting: Anesthesiology

## 2018-01-26 DIAGNOSIS — Z79899 Other long term (current) drug therapy: Secondary | ICD-10-CM | POA: Diagnosis not present

## 2018-01-26 DIAGNOSIS — Z7951 Long term (current) use of inhaled steroids: Secondary | ICD-10-CM | POA: Insufficient documentation

## 2018-01-26 DIAGNOSIS — K635 Polyp of colon: Secondary | ICD-10-CM | POA: Insufficient documentation

## 2018-01-26 DIAGNOSIS — R195 Other fecal abnormalities: Secondary | ICD-10-CM | POA: Insufficient documentation

## 2018-01-26 DIAGNOSIS — Z87891 Personal history of nicotine dependence: Secondary | ICD-10-CM | POA: Insufficient documentation

## 2018-01-26 DIAGNOSIS — J449 Chronic obstructive pulmonary disease, unspecified: Secondary | ICD-10-CM | POA: Diagnosis not present

## 2018-01-26 DIAGNOSIS — Z7982 Long term (current) use of aspirin: Secondary | ICD-10-CM | POA: Diagnosis not present

## 2018-01-26 DIAGNOSIS — D125 Benign neoplasm of sigmoid colon: Secondary | ICD-10-CM | POA: Insufficient documentation

## 2018-01-26 DIAGNOSIS — Z8249 Family history of ischemic heart disease and other diseases of the circulatory system: Secondary | ICD-10-CM | POA: Diagnosis not present

## 2018-01-26 HISTORY — PX: COLONOSCOPY WITH PROPOFOL: SHX5780

## 2018-01-26 SURGERY — COLONOSCOPY WITH PROPOFOL
Anesthesia: General

## 2018-01-26 MED ORDER — SODIUM CHLORIDE 0.9 % IV SOLN
INTRAVENOUS | Status: DC
  Administered 2018-01-26: 1000 mL via INTRAVENOUS

## 2018-01-26 MED ORDER — LACTATED RINGERS IV SOLN
INTRAVENOUS | Status: DC | PRN
  Administered 2018-01-26: 11:00:00 via INTRAVENOUS

## 2018-01-26 MED ORDER — PROPOFOL 10 MG/ML IV BOLUS
INTRAVENOUS | Status: DC | PRN
  Administered 2018-01-26: 320 mg via INTRAVENOUS

## 2018-01-26 NOTE — Op Note (Signed)
[facility] Patient Name: Brittney Doyle Procedure Date: 01/26/2018 10:46 AM MRN: [id number] Account #: [id number] Date of Birth: [birth year] Admit Type: Outpatient Age: 57 Room: [facility] ENDO ROOM 4 Gender: Female Note Status: Finalized Procedure:            Colonoscopy Indications:          Positive Cologuard test Providers:            Jonathon Bellows MD, MD Referring MD:         Arnetha Courser (Referring MD) Medicines:            Monitored Anesthesia Care Complications:        No immediate complications. Procedure:            Pre-Anesthesia Assessment:                       - Prior to the procedure, a History and Physical was                        performed, and patient medications, allergies and                        sensitivities were reviewed. The patient's tolerance of                        previous anesthesia was reviewed.                       - The risks and benefits of the procedure and the                        sedation options and risks were discussed with the                        patient. All questions were answered and informed                        consent was obtained.                       - ASA Grade Assessment: II - A patient with mild                        systemic disease.                       After obtaining informed consent, the colonoscope was                        passed under direct vision. Throughout the procedure,                        the patient's blood pressure, pulse, and oxygen                        saturations were monitored continuously. The                        Colonoscope was introduced through the anus and                        advanced  to the the cecum, identified by the                        appendiceal orifice, IC valve and transillumination.                        The colonoscopy was performed with ease. The patient                        tolerated the procedure well. The quality of the bowel                     preparation was good. Findings:      The perianal and digital rectal examinations were normal.      A 6 mm polyp was found in the sigmoid colon. The polyp was sessile. The       polyp was removed with a cold snare. Resection and retrieval were       complete.      A 3 mm polyp was found in the sigmoid colon. The polyp was sessile. The       polyp was removed with a cold biopsy forceps. Resection and retrieval       were complete.      The exam was otherwise without abnormality on direct and retroflexion       views. Impression:           - One 6 mm polyp in the sigmoid colon, removed with a                        cold snare. Resected and retrieved.                       - One 3 mm polyp in the sigmoid colon, removed with a                        cold biopsy forceps. Resected and retrieved.                       - The examination was otherwise normal on direct and                        retroflexion views. Recommendation:       - Discharge patient to home (with escort).                       - Resume previous diet.                       - Continue present medications.                       - Await pathology results.                       - Repeat colonoscopy in 5-10 years for surveillance. Procedure Code(s):    --- Professional ---                       [phone], Colonoscopy, flexible; with removal of tumor(s),                        polyp(s),  or other lesion(s) by snare technique                       [id number], 59, Colonoscopy, flexible; with biopsy, single                        or multiple Diagnosis Code(s):    --- Professional ---                       D12.5, Benign neoplasm of sigmoid colon                       R19.5, Other fecal abnormalities CPT copyright 2018 American Medical Association. All rights reserved. The codes documented in this report are preliminary and upon coder review may  be revised to meet current compliance requirements. Jonathon Bellows, MD Jonathon Bellows  MD, MD 01/26/2018 11:28:14 AM This report has been signed electronically. Number of Addenda: 0 Note Initiated On: 01/26/2018 10:46 AM Scope Withdrawal Time: 0 hours 20 minutes 4 seconds  Total Procedure Duration: 0 hours 22 minutes 4 seconds       [facility]

## 2018-01-26 NOTE — Anesthesia Preprocedure Evaluation (Signed)
Anesthesia Evaluation  Patient identified by MRN, date of birth, ID band Patient awake    Reviewed: Allergy & Precautions, NPO status , Patient's Chart, lab work & pertinent test results  Airway Mallampati: II       Dental   Pulmonary shortness of breath, COPD, former smoker,    Pulmonary exam normal        Cardiovascular negative cardio ROS Normal cardiovascular exam     Neuro/Psych Anxiety    GI/Hepatic negative GI ROS, Neg liver ROS,   Endo/Other  negative endocrine ROS  Renal/GU negative Renal ROS  negative genitourinary   Musculoskeletal negative musculoskeletal ROS (+)   Abdominal Normal abdominal exam  (+)   Peds negative pediatric ROS (+)  Hematology negative hematology ROS (+)   Anesthesia Other Findings   Reproductive/Obstetrics                             Anesthesia Physical Anesthesia Plan  ASA: III  Anesthesia Plan: General   Post-op Pain Management:    Induction: Intravenous  PONV Risk Score and Plan:   Airway Management Planned: Nasal Cannula  Additional Equipment:   Intra-op Plan:   Post-operative Plan:   Informed Consent: I have reviewed the patients History and Physical, chart, labs and discussed the procedure including the risks, benefits and alternatives for the proposed anesthesia with the patient or authorized representative who has indicated his/her understanding and acceptance.   Dental advisory given  Plan Discussed with: CRNA  Anesthesia Plan Comments:         Anesthesia Quick Evaluation

## 2018-01-26 NOTE — H&P (Signed)
Jonathon Bellows, MD [address], [place], [place], [place], [address] [address], [place], [place], [place], [address] Phone: [phone]  Fax: [phone]  Primary Care Physician:  Arnetha Courser, MD   Pre-Procedure History & Physical: HPI:  Brittney Doyle is a 57 y.o. female is here for an colonoscopy.   Past Medical History:  Diagnosis Date  . COPD (chronic obstructive pulmonary disease) ([place])   . Shortness of breath     Past Surgical History:  Procedure Laterality Date  . ABDOMINAL HYSTERECTOMY  1999    Prior to Admission medications   Medication Sig Start Date End Date Taking? Authorizing Provider  SYMBICORT 160-4.5 MCG/ACT inhaler Inhale 2 puffs into the lungs 2 (two) times daily. USE 2 PUFFS BY MOUTH TWO  TIMES DAILY 09/28/17  Yes Lada, Satira Anis, MD  albuterol (PROVENTIL) (2.5 MG/3ML) 0.083% nebulizer solution Take 3 mLs (2.5 mg total) by nebulization every 6 (six) hours as needed for wheezing or shortness of breath. 12/13/17   Poulose, Bethel Born, NP  aspirin EC 81 MG tablet Take 1 tablet (81 mg total) by mouth daily. 01/25/18   Lada, Satira Anis, MD  fexofenadine-pseudoephedrine (ALLEGRA-D 24) 180-240 MG 24 hr tablet Take 1 tablet by mouth as needed.     [provider]  fluticasone (FLONASE) 50 MCG/ACT nasal spray Place 2 sprays into both nostrils daily. Patient taking differently: Place 2 sprays into both nostrils as needed.  07/11/17   Lada, Satira Anis, MD  montelukast (SINGULAIR) 10 MG tablet Take 1 tablet (10 mg total) by mouth at bedtime. 09/28/17   Arnetha Courser, MD  Tiotropium Bromide Monohydrate (SPIRIVA RESPIMAT) 2.5 MCG/ACT AERS Take 2 puffs by mouth daily. 09/28/17   Arnetha Courser, MD    Allergies as of 01/12/2018  . (No Known Allergies)    Family History  Problem Relation Age of Onset  . Heart attack Mother        Died of MI at age 48  . Heart attack Father        Died of MI at age 26  . Heart attack Brother      Social History   Socioeconomic History  . Marital status: Married    Spouse name: Lennette Bihari  . Number of children: 2  . Years of education: 47  . Highest education level: Associate degree: academic program  Occupational History  . Not on file  Social Needs  . Financial resource strain: Not hard at all  . Food insecurity:    Worry: Never true    Inability: Never true  . Transportation needs:    Medical: No    Non-medical: No  Tobacco Use  . Smoking status: Former Smoker    Packs/day: 0.75    Types: Cigarettes    Last attempt to quit: 04/19/2007    Years since quitting: 10.7  . Smokeless tobacco: Never Used  Substance and Sexual Activity  . Alcohol use: Yes    Alcohol/week: 0.0 standard drinks    Comment: occasional  . Drug use: No  . Sexual activity: Yes  Lifestyle  . Physical activity:    Days per week: 0 days    Minutes per session: 0 min  . Stress: Not at all  Relationships  . Social connections:    Talks on phone: Three times a week    Gets together: Once a week    Attends religious service: 1 to 4  times per year    Active member of club or organization: No    Attends meetings of clubs or organizations: Never    Relationship status: Married  . Intimate partner violence:    Fear of current or ex partner: No    Emotionally abused: No    Physically abused: No    Forced sexual activity: No  Other Topics Concern  . Not on file  Social History Narrative  . Not on file    Review of Systems: See HPI, otherwise negative ROS  Physical Exam: BP (!) 148/101   Pulse (!) 108   Temp (!) 96.9 F (36.1 C) (Tympanic)   Resp 18   Ht 5' (1.524 m)   Wt 49 kg   SpO2 98%   BMI 21.09 kg/m  General:   Alert,  pleasant and cooperative in NAD Head:  Normocephalic and atraumatic. Neck:  Supple; no masses or thyromegaly. Lungs:  Clear throughout to auscultation, normal respiratory effort.    Heart:  +S1, +S2, Regular rate and rhythm, No edema. Abdomen:  Soft, nontender  and nondistended. Normal bowel sounds, without guarding, and without rebound.   Neurologic:  Alert and  oriented x4;  grossly normal neurologically.  Impression/Plan: Brittney Doyle is here for an colonoscopy to be performed for POSITIVE COLOGUARD Risks, benefits, limitations, and alternatives regarding  colonoscopy have been reviewed with the patient.  Questions have been answered.  All parties agreeable.   Jonathon Bellows, MD  01/26/2018, 10:45 AM

## 2018-01-26 NOTE — Transfer of Care (Signed)
Immediate Anesthesia Transfer of Care Note  Patient: Brittney Doyle  Procedure(s) Performed: COLONOSCOPY WITH PROPOFOL (N/A )  Patient Location: PACU and Endoscopy Unit  Anesthesia Type:General  Level of Consciousness: awake, alert  and oriented  Airway & Oxygen Therapy: Patient Spontanous Breathing  Post-op Assessment: Report given to RN  Post vital signs: Reviewed and stable  Last Vitals:  Vitals Value Taken Time  BP    Temp    Pulse 96 01/26/2018 11:33 AM  Resp 22 01/26/2018 11:33 AM  SpO2 100 % 01/26/2018 11:33 AM  Vitals shown include unvalidated device data.  Last Pain:  Vitals:   01/26/18 0921  TempSrc: Tympanic  PainSc: 0-No pain         Complications: No apparent anesthesia complications

## 2018-01-26 NOTE — Anesthesia Post-op Follow-up Note (Signed)
Anesthesia QCDR form completed.        

## 2018-01-26 NOTE — Anesthesia Postprocedure Evaluation (Signed)
Anesthesia Post Note  Patient: Brittney Doyle  Procedure(s) Performed: COLONOSCOPY WITH PROPOFOL (N/A )  Patient location during evaluation: Endoscopy Anesthesia Type: General Level of consciousness: awake and alert and oriented Pain management: pain level controlled Vital Signs Assessment: post-procedure vital signs reviewed and stable Respiratory status: spontaneous breathing Cardiovascular status: blood pressure returned to baseline Anesthetic complications: no     Last Vitals:  Vitals:   01/26/18 0921 01/26/18 1133  BP: (!) 148/101 128/83  Pulse: (!) 108   Resp: 18   Temp: (!) 36.1 C (!) 36.1 C  SpO2: 98%     Last Pain:  Vitals:   01/26/18 1133  TempSrc: Tympanic  PainSc:                  Brittney Doyle

## 2018-01-29 LAB — SURGICAL PATHOLOGY

## 2018-01-30 NOTE — Assessment & Plan Note (Signed)
USPSTF grade A and B recommendations reviewed with patient; age-appropriate recommendations, preventive care, screening tests, etc discussed and encouraged; healthy living encouraged; see AVS for patient education given to patient  

## 2018-02-08 ENCOUNTER — Encounter: Payer: Self-pay | Admitting: Family Medicine

## 2018-02-10 ENCOUNTER — Other Ambulatory Visit: Payer: Self-pay | Admitting: Family Medicine

## 2018-02-10 DIAGNOSIS — J449 Chronic obstructive pulmonary disease, unspecified: Secondary | ICD-10-CM

## 2018-02-11 ENCOUNTER — Encounter: Payer: Self-pay | Admitting: Gastroenterology

## 2018-02-12 ENCOUNTER — Other Ambulatory Visit: Payer: Self-pay | Admitting: Nurse Practitioner

## 2018-02-12 MED ORDER — MONTELUKAST SODIUM 10 MG PO TABS: 10.0000 mg | ORAL_TABLET | Freq: Every day | ORAL | 0 refills | Status: DC

## 2018-04-12 ENCOUNTER — Telehealth: Payer: Self-pay | Admitting: Family Medicine

## 2018-04-12 ENCOUNTER — Other Ambulatory Visit: Payer: Self-pay | Admitting: Family Medicine

## 2018-04-12 DIAGNOSIS — D692 Other nonthrombocytopenic purpura: Secondary | ICD-10-CM

## 2018-04-12 DIAGNOSIS — D7589 Other specified diseases of blood and blood-forming organs: Secondary | ICD-10-CM

## 2018-04-12 NOTE — Telephone Encounter (Signed)
Please remind patient of multiple outstanding orders She has labs overdue; I have re-entered, extended those orders She has an xray ordered by Edgardo Roys, DNP She has a DEXA to do Please let her know how to get these done

## 2018-04-12 NOTE — Telephone Encounter (Signed)
Left detailed VM, CRM created.  

## 2018-04-20 ENCOUNTER — Other Ambulatory Visit: Payer: Self-pay | Admitting: Family Medicine

## 2018-04-21 LAB — CBC WITH DIFFERENTIAL/PLATELET
BASOS: 1 %
Basophils Absolute: 0.1 10*3/uL (ref 0.0–0.2)
EOS (ABSOLUTE): 0.2 10*3/uL (ref 0.0–0.4)
Eos: 3 %
Hematocrit: 44.8 % (ref 34.0–46.6)
Hemoglobin: 15.4 g/dL (ref 11.1–15.9)
IMMATURE GRANS (ABS): 0 10*3/uL (ref 0.0–0.1)
Immature Granulocytes: 0 %
Lymphocytes Absolute: 2.1 10*3/uL (ref 0.7–3.1)
Lymphs: 35 %
MCH: 33.6 pg — ABNORMAL HIGH (ref 26.6–33.0)
MCHC: 34.4 g/dL (ref 31.5–35.7)
MCV: 98 fL — AB (ref 79–97)
MONOS ABS: 0.5 10*3/uL (ref 0.1–0.9)
Monocytes: 9 %
NEUTROS ABS: 3.1 10*3/uL (ref 1.4–7.0)
Neutrophils: 52 %
PLATELETS: 271 10*3/uL (ref 150–450)
RBC: 4.59 x10E6/uL (ref 3.77–5.28)
RDW: 12.1 % — AB (ref 12.3–15.4)
WBC: 6 10*3/uL (ref 3.4–10.8)

## 2018-04-21 LAB — LIPID PANEL W/O CHOL/HDL RATIO
Cholesterol, Total: 215 mg/dL — ABNORMAL HIGH (ref 100–199)
HDL: 91 mg/dL (ref >39)
LDL Calculated: 108 mg/dL — ABNORMAL HIGH (ref 0–99)
Triglycerides: 80 mg/dL (ref 0–149)
VLDL CHOLESTEROL CAL: 16 mg/dL (ref 5–40)

## 2018-04-21 LAB — FOLATE: Folate: 5.7 ng/mL (ref >3.0)

## 2018-04-21 LAB — SPECIMEN STATUS REPORT

## 2018-04-21 LAB — VITAMIN B12: VITAMIN B 12: 742 pg/mL (ref 232–1245)

## 2018-06-25 ENCOUNTER — Telehealth: Payer: Self-pay | Admitting: Family Medicine

## 2018-06-25 DIAGNOSIS — J449 Chronic obstructive pulmonary disease, unspecified: Secondary | ICD-10-CM

## 2018-06-26 NOTE — Telephone Encounter (Signed)
Please contact patient Ask her to schedule a visit She has outstanding CXR She has outstanding DEXA I don't see spirometry on the chart for the last year Mammogram shows a care gap  I'll refill medicine this time and we look forward to seeing her within one month

## 2018-06-27 NOTE — Telephone Encounter (Signed)
lvm informing pt that script has been sent to pharmacy also asked her to give [place] a call to schedule appt

## 2018-09-10 ENCOUNTER — Other Ambulatory Visit: Payer: Self-pay | Admitting: Family Medicine

## 2018-09-10 DIAGNOSIS — J449 Chronic obstructive pulmonary disease, unspecified: Secondary | ICD-10-CM

## 2018-11-15 ENCOUNTER — Other Ambulatory Visit: Payer: Self-pay | Admitting: Family Medicine

## 2018-11-15 DIAGNOSIS — J449 Chronic obstructive pulmonary disease, unspecified: Secondary | ICD-10-CM

## 2018-11-15 DIAGNOSIS — J301 Allergic rhinitis due to pollen: Secondary | ICD-10-CM

## 2018-12-06 IMAGING — US US BREAST*R* LIMITED INC AXILLA
1 series · 2 of 2 positions shown · non-contrast
Comparison: Previous exam(s).

CLINICAL DATA: Patient presents for additional views of the right
breast as followup to a recent screening exam to evaluate a possible
asymmetry.

EXAM:
2D DIGITAL DIAGNOSTIC right MAMMOGRAM WITH ADJUNCT TOMO
ULTRASOUND right BREAST

[Series 1: us breast*right* limited inc axilla · 0.05mm/px · 2 of 2 slices shown]
[im 1/2]
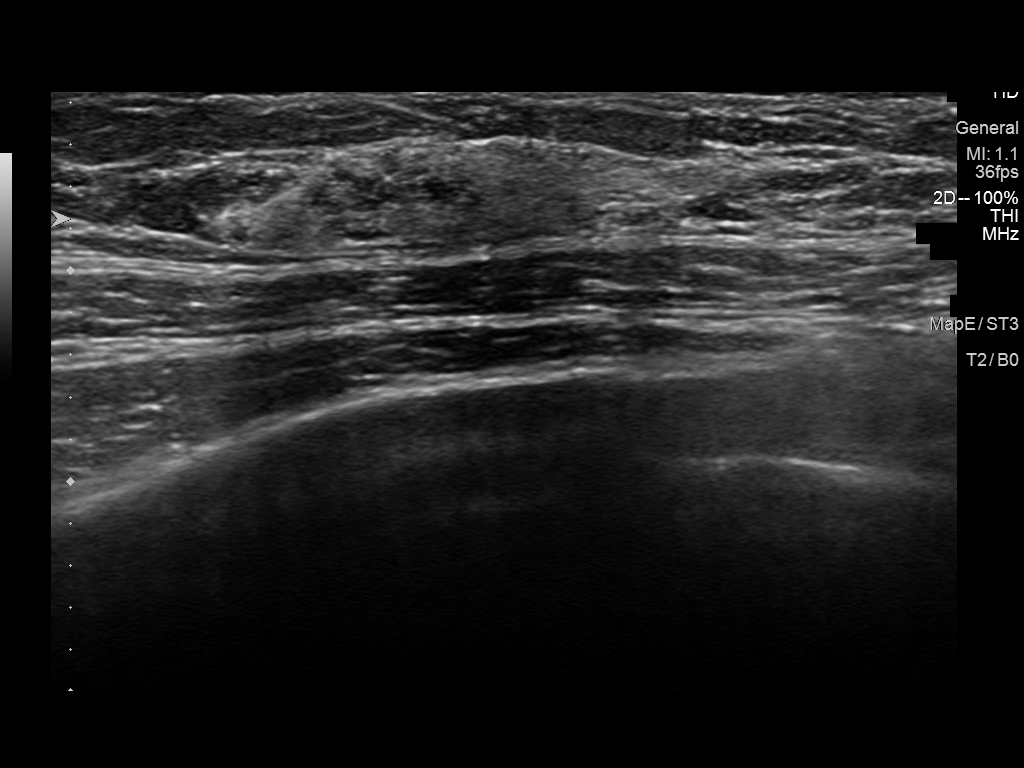
[im 2/2]
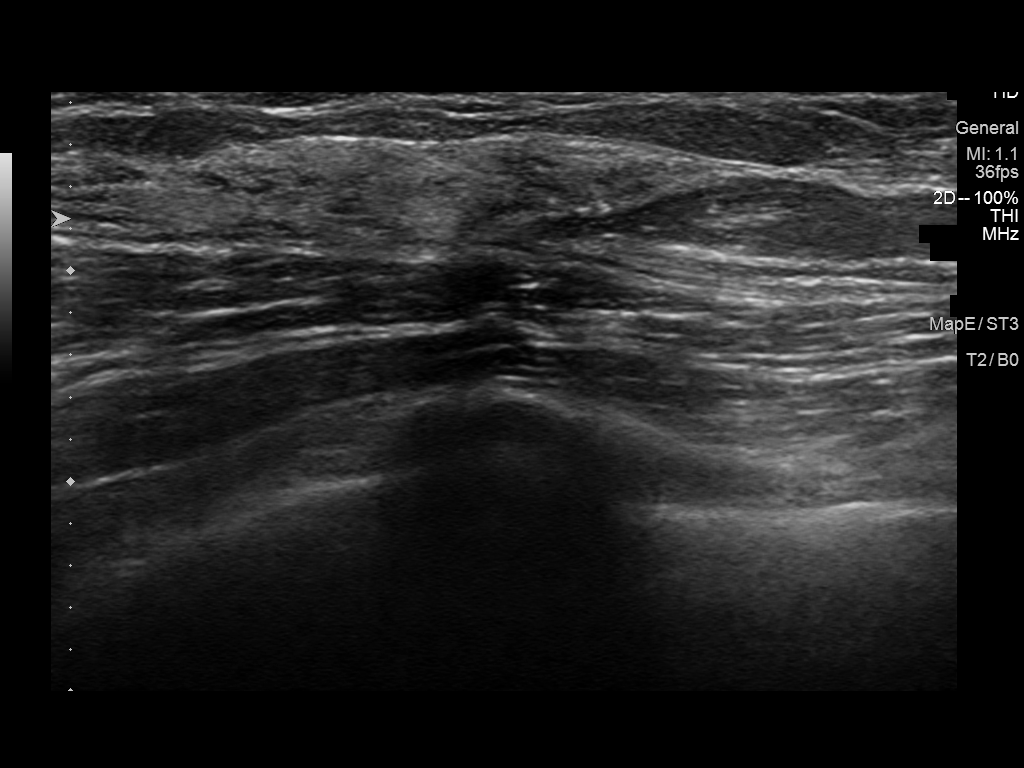

[2 of 2 positions shown; findings below may reference images not displayed]

ACR Breast Density Category c: The breast tissue is heterogeneously
dense, which may obscure small masses.
FINDINGS: Multiple additional tomographic images demonstrate mild focal
asymmetry over the posterior third of the upper mid to outer right
breast with an appearance suggesting asymmetric fibroglandular
tissue.

Targeted ultrasound is performed, showing an island of dense
fibroglandular tissue over the 12 o'clock position of the right
breast 12 cm from the nipple corresponding to the mammographic
abnormality. Remainder of the upper outer right breast is
unremarkable.
IMPRESSION: No focal abnormality over the upper right breast.

RECOMMENDATION:
Recommend continued annual bilateral screening mammographic
followup.

I have discussed the findings and recommendations with the patient.
Results were also provided in writing at the conclusion of the
visit. If applicable, a reminder letter will be sent to the patient
regarding the next appointment.

BI-RADS CATEGORY  1: Negative.

## 2019-01-30 ENCOUNTER — Other Ambulatory Visit: Payer: Self-pay

## 2019-01-30 MED ORDER — MONTELUKAST SODIUM 10 MG PO TABS: 10.0000 mg | ORAL_TABLET | Freq: Every day | ORAL | 0 refills | Status: DC

## 2019-01-30 NOTE — Telephone Encounter (Signed)
Lvm to sch appt °

## 2019-01-30 NOTE — Telephone Encounter (Signed)
Please schedule patient for follow up in the next 30 days.  

## 2019-02-26 ENCOUNTER — Telehealth: Payer: Self-pay | Admitting: Family Medicine

## 2019-02-26 NOTE — Telephone Encounter (Signed)
Please schedule patient for follow up in the next 15 days.  

## 2019-02-26 NOTE — Telephone Encounter (Signed)
lvm for scheduling °

## 2019-02-26 NOTE — Telephone Encounter (Signed)
Requested medication (s) are due for refill today: yes  Requested medication (s) are on the active medication list: yes  Last refill:  01/30/2019  Future visit scheduled: no  Notes to clinic: overdue for office visit  Review for refill   Requested Prescriptions  Pending Prescriptions Disp Refills   montelukast (SINGULAIR) 10 MG tablet [Pharmacy Med Name: MONTELUKAST SOD 10 MG TABLET] 30 tablet 0    Sig: TAKE 1 TABLET BY MOUTH EVERYDAY AT BEDTIME     Pulmonology:  Leukotriene Inhibitors Failed - 02/26/2019  1:48 AM      Failed - Valid encounter within last 12 months    Recent Outpatient Visits          1 year ago Preventative health care   [facility] Lada, Satira Anis, MD   1 year ago Shortness of breath   [place], NP   1 year ago Chronic obstructive pulmonary disease, unspecified COPD type [facility])   [place], Satira Anis, MD   2 years ago Seasonal allergic rhinitis due to pollen   [place], MD   3 years ago Chronic obstructive pulmonary disease, unspecified COPD type [facility])   [facility] Roselee Nova, MD

## 2019-03-04 ENCOUNTER — Other Ambulatory Visit: Payer: Self-pay

## 2019-03-04 ENCOUNTER — Ambulatory Visit (INDEPENDENT_AMBULATORY_CARE_PROVIDER_SITE_OTHER): Payer: Managed Care, Other (non HMO) | Admitting: Family Medicine

## 2019-03-04 ENCOUNTER — Encounter: Payer: Self-pay | Admitting: Family Medicine

## 2019-03-04 VITALS — Ht 60.0 in | Wt 109.0 lb

## 2019-03-04 DIAGNOSIS — J301 Allergic rhinitis due to pollen: Secondary | ICD-10-CM

## 2019-03-04 DIAGNOSIS — Z1231 Encounter for screening mammogram for malignant neoplasm of breast: Secondary | ICD-10-CM

## 2019-03-04 DIAGNOSIS — J449 Chronic obstructive pulmonary disease, unspecified: Secondary | ICD-10-CM | POA: Diagnosis not present

## 2019-03-04 MED ORDER — SYMBICORT 160-4.5 MCG/ACT IN AERO: INHALATION_SPRAY | RESPIRATORY_TRACT | 3 refills | Status: DC

## 2019-03-04 MED ORDER — SPIRIVA RESPIMAT 2.5 MCG/ACT IN AERS: INHALATION_SPRAY | RESPIRATORY_TRACT | 3 refills | Status: DC

## 2019-03-04 MED ORDER — MONTELUKAST SODIUM 10 MG PO TABS: 10.0000 mg | ORAL_TABLET | Freq: Every day | ORAL | 3 refills | Status: DC

## 2019-03-04 MED ORDER — FLUTICASONE PROPIONATE 50 MCG/ACT NA SUSP: 2.0000 | NASAL | 3 refills | Status: DC | PRN

## 2019-03-04 MED ORDER — ALBUTEROL SULFATE HFA 108 (90 BASE) MCG/ACT IN AERS: 2.0000 | INHALATION_SPRAY | RESPIRATORY_TRACT | 2 refills | Status: DC | PRN

## 2019-03-04 NOTE — Patient Instructions (Signed)
If you get a head or chest cold - you can always start treatment at home with cold medicines, antihistamines, cough medicines.    I highly recommend getting mucinex, pushing fluids, and using delsym or robitussin and your nebulizer. Please let us know if you get ill, so we can set up a virtual appointment and help assess you and get you steroids and other medicines you may need.  Extra info below on COPD exacerbations:  Chronic Obstructive Pulmonary Disease Exacerbation Chronic obstructive pulmonary disease (COPD) is a long-term (chronic) lung problem. In COPD, the flow of air from the lungs is limited. COPD exacerbations are times that breathing gets worse and you need more than your normal treatment. Without treatment, they can be life threatening. If they happen often, your lungs can become more damaged. If your COPD gets worse, your doctor may treat you with:  Medicines.  Oxygen.  Different ways to clear your airway, such as using a mask. Follow these instructions at home: Medicines  Take over-the-counter and prescription medicines only as told by your doctor.  If you take an antibiotic or steroid medicine, do not stop taking the medicine even if you start to feel better.  Keep up with shots (vaccinations) as told by your doctor. Be sure to get a yearly (annual) flu shot. Lifestyle  Do not smoke. If you need help quitting, ask your doctor.  Eat healthy foods.  Exercise regularly.  Get plenty of sleep.  Avoid tobacco smoke and other things that can bother your lungs.  Wash your hands often with soap and water. This will help keep you from getting an infection. If you cannot use soap and water, use hand sanitizer.  During flu season, avoid areas that are crowded with people. General instructions  Drink enough fluid to keep your pee (urine) clear or pale yellow. Do not do this if your doctor has told you not to.  Use a cool mist machine (vaporizer).  If you use oxygen or  a machine that turns medicine into a mist (nebulizer), continue to use it as told.  Follow all instructions for rehabilitation. These are steps you can take to make your body work better.  Keep all follow-up visits as told by your doctor. This is important. Contact a doctor if:  Your COPD symptoms get worse than normal. Get help right away if:  You are short of breath and it gets worse.  You have trouble talking.  You have chest pain.  You cough up blood.  You have a fever.  You keep throwing up (vomiting).  You feel weak or you pass out (faint).  You feel confused.  You are not able to sleep because of your symptoms.  You are not able to do daily activities. Summary  COPD exacerbations are times that breathing gets worse and you need more treatment than normal.  COPD exacerbations can be very serious and may cause your lungs to become more damaged.  Do not smoke. If you need help quitting, ask your doctor.  Stay up-to-date on your shots. Get a flu shot every year. This information is not intended to replace advice given to you by your health care provider. Make sure you discuss any questions you have with your health care provider. Document Released: 03/24/2011 Document Revised: 03/17/2017 Document Reviewed: 05/09/2016 Elsevier Patient Education  2020 [employer].

## 2019-03-04 NOTE — Progress Notes (Signed)
Name: Brittney Doyle   MRN: [id number]    DOB: Nov 09, 1960   Date:03/04/2019       Progress Note  Subjective:    Chief Complaint  Chief Complaint  Patient presents with   Follow-up   Medication Refill   COPD    I connected with  Loma Sender  on 03/04/19 at  8:40 AM EST by a video enabled telemedicine application and verified that I am speaking with the correct person using two identifiers.  I discussed the limitations of evaluation and management by telemedicine and the availability of in person appointments. The patient expressed understanding and agreed to proceed. Staff also discussed with the patient that there may be a patient responsible charge related to this service. Patient Location: home Provider Location: [facility] clinic Additional Individuals present: none  HPI  COPD:   Patient has COPD maintained on Symbicort and Spiriva.  She has been compliant with her maintenance inhalers, cannot afford them and is getting them through the mail order pharmacy.  She does request refills of them today.  She has not had a COPD exacerbation since last year in August 2019.  She has rarely been using her rescue inhaler.  She denies any wheeze, progressive or exertional shortness of breath, chest pain, near syncope.  She does request a refill on her albuterol inhaler.  She got a notification from her pharmacy saying that proair will no longer be covered she is unclear what they will be covering.  She does have a nebulizer machine and a small box of albuterol vials.  She has never used them but purchased them last year when she had to go to the hospital.   Seasonal allergies/allergic rhinitis: She does use Flonase seasonally, and Singulair.  Sometimes she takes Allegra-D but she has not been taking that for the past several months and is not currently on a second generation antihistamine.  She is not having trouble with nasal congestion, discharge, postnasal drip right now.  Patient is  unsure of the last time she saw Dr. Jacinto Reap or to the physical, did chart review today with the patient her last physical was October 2019, she is due for a annual well exam, was invited to come in whenever she feels comfortable for doing that.  Did review that she has history of hyperlipidemia, but is not currently on medication.  She is due for her mammograms that order was put in for her.    Patient Active Problem List   Diagnosis Date Noted   Preventative health care 01/25/2018   HLD (hyperlipidemia) 12/08/2017   Abnormal liver enzymes 12/08/2017   Macrocytosis 10/04/2017   Situational anxiety 11/09/2015   Acute bronchitis 07/30/2015   Seasonal allergic rhinitis 07/30/2015   COPD (chronic obstructive pulmonary disease) ([place]) 06/03/2015    Past Surgical History:  Procedure Laterality Date   ABDOMINAL HYSTERECTOMY  1999   COLONOSCOPY WITH PROPOFOL N/A 01/26/2018   Procedure: COLONOSCOPY WITH PROPOFOL;  Surgeon: Jonathon Bellows, MD;  Location: [facility] ENDOSCOPY;  Service: Gastroenterology;  Laterality: N/A;    Family History  Problem Relation Age of Onset   Heart attack Mother        Died of MI at age 50   Heart attack Father        Died of MI at age 86   Heart attack Brother     Social History   Socioeconomic History   Marital status: Married    Spouse name: Lennette Bihari   Number of children:  2   Years of education: 47   Highest education level: Associate degree: academic program  Occupational History   Not on file  Social Needs   Financial resource strain: Not hard at all   Food insecurity    Worry: Never true    Inability: Never true   Transportation needs    Medical: No    Non-medical: No  Tobacco Use   Smoking status: Former Smoker    Packs/day: 0.75    Types: Cigarettes    Quit date: 04/19/2007    Years since quitting: 11.8   Smokeless tobacco: Never Used  Substance and Sexual Activity   Alcohol use: Yes    Alcohol/week: 0.0 standard drinks     Comment: occasional   Drug use: No   Sexual activity: Yes  Lifestyle   Physical activity    Days per week: 0 days    Minutes per session: 0 min   Stress: Not at all  Relationships   Social connections    Talks on phone: Three times a week    Gets together: Once a week    Attends religious service: 1 to 4 times per year    Active member of club or organization: No    Attends meetings of clubs or organizations: Never    Relationship status: Married   Intimate partner violence    Fear of current or ex partner: No    Emotionally abused: No    Physically abused: No    Forced sexual activity: No  Other Topics Concern   Not on file  Social History Narrative   Not on file     Current Outpatient Medications:    albuterol (PROVENTIL) (2.5 MG/3ML) 0.083% nebulizer solution, Take 3 mLs (2.5 mg total) by nebulization every 6 (six) hours as needed for wheezing or shortness of breath., Disp: 50 mL, Rfl: 0   aspirin EC 81 MG tablet, Take 1 tablet (81 mg total) by mouth daily., Disp: , Rfl:    fexofenadine-pseudoephedrine (ALLEGRA-D 24) 180-240 MG 24 hr tablet, Take 1 tablet by mouth as needed. , Disp: , Rfl:    fluticasone (FLONASE) 50 MCG/ACT nasal spray, Place 2 sprays into both nostrils as needed., Disp: 16 g, Rfl: 3   montelukast (SINGULAIR) 10 MG tablet, Take 1 tablet (10 mg total) by mouth at bedtime., Disp: 90 tablet, Rfl: 3   SYMBICORT 160-4.5 MCG/ACT inhaler, USE 2 INHALATIONS BY MOUTH  2 TIMES DAILY, Disp: 30.6 g, Rfl: 3   Tiotropium Bromide Monohydrate (SPIRIVA RESPIMAT) 2.5 MCG/ACT AERS, TAKE 2 INHALATIONS BY MOUTH DAILY, Disp: 12 g, Rfl: 3   zinc sulfate 220 (50 Zn) MG capsule, Take 220 mg by mouth daily., Disp: , Rfl:    albuterol (VENTOLIN HFA) 108 (90 Base) MCG/ACT inhaler, Inhale 2 puffs into the lungs every 4 (four) hours as needed for wheezing or shortness of breath., Disp: 18 g, Rfl: 2  No Known Allergies  I personally reviewed active problem list,  medication list, allergies, family history, social history, health maintenance, notes from last encounter, lab results, imaging with the patient/caregiver today.  Review of Systems  Constitutional: Negative.   HENT: Negative.   Eyes: Negative.   Respiratory: Negative.   Cardiovascular: Negative.   Gastrointestinal: Negative.   Endocrine: Negative.   Genitourinary: Negative.   Musculoskeletal: Negative.   Skin: Negative.   Allergic/Immunologic: Negative.   Neurological: Negative.   Hematological: Negative.   Psychiatric/Behavioral: Negative.   All other systems reviewed and are negative.  Objective:    Virtual encounter, vitals limited, only able to obtain the following Today's Vitals   03/04/19 0821  Weight: 109 lb (49.4 kg)  Height: 5' (1.524 m)   Body mass index is 21.29 kg/m. Nursing Note and Vital Signs reviewed.  Physical Exam Vitals signs and nursing note reviewed.  Constitutional:      General: She is not in acute distress.    Appearance: Normal appearance. She is well-developed. She is not ill-appearing, toxic-appearing or diaphoretic.  HENT:     Head: Normocephalic and atraumatic.     Nose: Nose normal.  Eyes:     General:        Right eye: No discharge.        Left eye: No discharge.     Conjunctiva/sclera: Conjunctivae normal.  Neck:     Trachea: No tracheal deviation.  Pulmonary:     Effort: Pulmonary effort is normal. No respiratory distress.     Breath sounds: No stridor.  Skin:    Findings: No rash.  Neurological:     Mental Status: She is alert.     Motor: No abnormal muscle tone.  Psychiatric:        Mood and Affect: Mood normal.        Behavior: Behavior normal.     PE limited by telephone encounter  No results found for this or any previous visit (from the past 72 hour(s)).  PHQ2/9: Depression screen [facility] 2/9 03/04/2019 01/25/2018 12/08/2017 09/28/2017 09/16/2016  Decreased Interest 0 0 0 0 0  Down, Depressed, Hopeless 0 0 0 0 0    PHQ - 2 Score 0 0 0 0 0  Altered sleeping 0 0 - - -  Tired, decreased energy 0 0 - - -  Change in appetite 0 0 - - -  Feeling bad or failure about yourself  0 0 - - -  Trouble concentrating 0 0 - - -  Moving slowly or fidgety/restless 0 0 - - -  Suicidal thoughts 0 0 - - -  PHQ-9 Score 0 0 - - -  Difficult doing work/chores Not difficult at all Not difficult at all - - -   PHQ-2/9 Result is negative, no action needed, reviewed today  Fall Risk: Fall Risk  03/04/2019 01/25/2018 12/08/2017 09/28/2017 09/16/2016  Falls in the past year? 0 No No No No  Number falls in past yr: 0 - - - -  Injury with Fall? 0 - - - -     Assessment and Plan:     ICD-10-CM   1. Chronic obstructive pulmonary disease, unspecified COPD type ([place])  J44.9 Tiotropium Bromide Monohydrate (SPIRIVA RESPIMAT) 2.5 MCG/ACT AERS    SYMBICORT 160-4.5 MCG/ACT inhaler   currently well controlled, not using SABA, compliant with maintenence inhalers - refills prescribed, discussed COPD exacerbations  2. Seasonal allergic rhinitis due to pollen  J30.1 fluticasone (FLONASE) 50 MCG/ACT nasal spray   refills on AR/seasonal allergy medicine, encouraged pt to use 2nd gen antihistmine OTC as well for seasonal allergies  3. Encounter for screening mammogram for breast cancer  Z12.31 MM 3D SCREEN BREAST BILATERAL   Patient invited to schedule an appointment in the next 3 to 6 months for her CPE  I discussed the assessment and treatment plan with the patient. The patient was provided an opportunity to ask questions and all were answered. The patient agreed with the plan and demonstrated an understanding of the instructions.  The patient was advised to call back or  seek an in-person evaluation if the symptoms worsen or if the condition fails to improve as anticipated.  I provided 12 minutes of non-face-to-face time during this encounter.  Delsa Grana, PA-C 11/16/209:05 AM

## 2019-06-04 ENCOUNTER — Encounter: Payer: Self-pay | Admitting: Family Medicine

## 2019-06-04 ENCOUNTER — Ambulatory Visit (INDEPENDENT_AMBULATORY_CARE_PROVIDER_SITE_OTHER): Payer: Managed Care, Other (non HMO) | Admitting: Family Medicine

## 2019-06-04 ENCOUNTER — Other Ambulatory Visit: Payer: Self-pay

## 2019-06-04 VITALS — BP 118/76 | HR 98 | Temp 97.9°F | Resp 12 | Ht 60.0 in | Wt 111.5 lb

## 2019-06-04 DIAGNOSIS — J4540 Moderate persistent asthma, uncomplicated: Secondary | ICD-10-CM | POA: Diagnosis not present

## 2019-06-04 DIAGNOSIS — R05 Cough: Secondary | ICD-10-CM

## 2019-06-04 DIAGNOSIS — E7800 Pure hypercholesterolemia, unspecified: Secondary | ICD-10-CM

## 2019-06-04 DIAGNOSIS — D7589 Other specified diseases of blood and blood-forming organs: Secondary | ICD-10-CM

## 2019-06-04 DIAGNOSIS — D692 Other nonthrombocytopenic purpura: Secondary | ICD-10-CM | POA: Insufficient documentation

## 2019-06-04 DIAGNOSIS — J301 Allergic rhinitis due to pollen: Secondary | ICD-10-CM | POA: Diagnosis not present

## 2019-06-04 DIAGNOSIS — Z0000 Encounter for general adult medical examination without abnormal findings: Secondary | ICD-10-CM | POA: Diagnosis not present

## 2019-06-04 DIAGNOSIS — J449 Chronic obstructive pulmonary disease, unspecified: Secondary | ICD-10-CM | POA: Diagnosis not present

## 2019-06-04 DIAGNOSIS — R059 Cough, unspecified: Secondary | ICD-10-CM

## 2019-06-04 MED ORDER — TRELEGY ELLIPTA 100-62.5-25 MCG/INH IN AEPB: 1.0000 | INHALATION_SPRAY | Freq: Every day | RESPIRATORY_TRACT | 3 refills | Status: DC

## 2019-06-04 MED ORDER — PANTOPRAZOLE SODIUM 20 MG PO TBEC: 20.0000 mg | DELAYED_RELEASE_TABLET | ORAL | 1 refills | Status: DC

## 2019-06-04 NOTE — Progress Notes (Signed)
Patient: Brittney Doyle, Female    DOB: 28-Mar-1961, 59 y.o.   MRN: [id number] Delsa Grana, PA-C Visit Date: 06/04/2019  Today's Provider: Delsa Grana, PA-C   Chief Complaint  Patient presents with  . Annual Exam    no pap   Subjective:   Annual physical exam:  Brittney Doyle is a 59 y.o. female who presents today for complete physical exam:  Exercise/Activity: Not very physically active right now due to Covid Diet/nutrition: Very healthy diet states she essentially eats pescetarian Sleep: well.  Asthma/COPD - stable sx, pt states well controlled on Symbicort and Spiriva, rarely uses her rescue inhaler but does seem to have some exercise-induced bronchospasm and she uses it for that, has a neb machine but has never used it. Interested in trelegy inhaler  06/04/19 0822  Asthma History  Symptoms 0-2 days/week  Nighttime Awakenings 0-2/month  Asthma interference with normal activity Minor limitations  SABA use (not for EIB) 0-2 days/wk  Risk: Exacerbations requiring oral systemic steroids 0-1 / year  Asthma Severity Moderate Persistent (no oral steroids in the past year, typical is one exacerbation a year)   Patient states her breathing has been fairly consistent and she is not having any exertional dyspnea other than some occasional exercise-induced bronchospasm, she has no chest pain, hemoptysis, unintentional weight loss.  She does report a gradual onset of persistent throat clearing feels like there is phlegm in the back of her throat and she does have nasal allergies which she treats with allergy medications as needed, she is currently using Flonase but not an antihistamine she will occasionally need Claritin with a decongestant.  Currently she denies nasal drainage or congestion, sore throat, cough, wheeze, fever, night sweats..  She does not feel postnasal drip and denies any acid reflux GERD or indigestion.  USPSTF grade A and B recommendations - reviewed and addressed  today  Depression:  Phq 9 completed today by patient, was reviewed by me with patient in the room, score is  negative, pt feels good, she has hx of some anxiety but feels like she is coping well. PHQ 2/9 Scores 06/04/2019 03/04/2019 01/25/2018 12/08/2017  PHQ - 2 Score 0 0 0 0  PHQ- 9 Score 0 0 0 -   Depression screen [facility] 2/9 06/04/2019 03/04/2019 01/25/2018 12/08/2017 09/28/2017  Decreased Interest 0 0 0 0 0  Down, Depressed, Hopeless 0 0 0 0 0  PHQ - 2 Score 0 0 0 0 0  Altered sleeping 0 0 0 - -  Tired, decreased energy 0 0 0 - -  Change in appetite 0 0 0 - -  Feeling bad or failure about yourself  0 0 0 - -  Trouble concentrating 0 0 0 - -  Moving slowly or fidgety/restless 0 0 0 - -  Suicidal thoughts 0 0 0 - -  PHQ-9 Score 0 0 0 - -  Difficult doing work/chores Not difficult at all Not difficult at all Not difficult at all - -    Alcohol screening:   [facility] Visit from 06/04/2019 in [facility]  AUDIT-C Score  3      Immunizations and Health Maintenance: Health Maintenance  Topic Date Due  . MAMMOGRAM  03/23/2018  . TETANUS/TDAP  06/03/2020 (Originally 02/17/1980)  . HIV Screening  06/03/2020 (Originally 02/17/1976)  . COLONOSCOPY  01/27/2023  . INFLUENZA VACCINE  Completed  . Hepatitis C Screening  Completed  . PAP SMEAR-Modifier  Discontinued    Hep  C Screening: done STD testing and prevention (HIV/chl/gon/syphilis): None needed Intimate partner violence:  Feels safe Sexual History/Pain during Intercourse:  none Married  Menstrual History/LMP/Abnormal Bleeding: no bleeding (1999)  - needs PAP but pt prefers to due next year No LMP recorded. Patient has had a hysterectomy.  (she states partial hysterectomy)  Incontinence Symptoms: none  Breast cancer:  Last Mammogram:   2018 mammogram due and ordered, pt to schedule BRCA gene screening: none  Cervical cancer screening: Will due  Family hx of cancers - breast, ovarian, uterine, colon:   Pt  denies (maybe a paternal aunt - breast CA)  Osteoporosis:   Discussed high calcium and vitamin D supplementation, weight bearing exercises  Pt is supplementing with daily calcium/Vit D. No past Bone scan/dexa   Skin cancer:  Hx of skin CA  Discussed atypical lesions   Colorectal cancer:   colonoscopy is due 2024   Lung cancer:   Low Dose CT Chest recommended if Age 11-80 years, 30 pack-year currently smoking OR have quit w/in 15years. Patient does not qualify.   Social History   Tobacco Use  . Smoking status: Former Smoker    Packs/day: 0.75    Types: Cigarettes    Quit date: 04/19/2007    Years since quitting: 12.1  . Smokeless tobacco: Never Used  Substance Use Topics  . Alcohol use: Yes    Alcohol/week: 0.0 standard drinks    Comment: occasional    ECG: none today  Blood pressure/Hypertension: BP Readings from Last 3 Encounters:  06/04/19 118/76  01/26/18 120/76  12/08/17 110/70    Weight/Obesity: Wt Readings from Last 3 Encounters:  06/04/19 111 lb 8 oz (50.6 kg)  03/04/19 109 lb (49.4 kg)  01/26/18 108 lb (49 kg)   BMI Readings from Last 3 Encounters:  06/04/19 21.78 kg/m  03/04/19 21.29 kg/m  01/26/18 21.09 kg/m     Lipids:  Lab Results  Component Value Date   CHOL 215 (H) 04/20/2018   Lab Results  Component Value Date   HDL 91 04/20/2018   Lab Results  Component Value Date   LDLCALC 108 (H) 04/20/2018   Lab Results  Component Value Date   TRIG 80 04/20/2018   No results found for: CHOLHDL No results found for: LDLDIRECT Based on the results of lipid panel his/her cardiovascular risk factor ( using [place] )  in the next 10 years is: The 10-year ASCVD risk score Mikey Bussing DC Brooke Bonito., et al., 2013) is: 1.6%   Values used to calculate the score:     Age: 33 years     Sex: Female     Is Non-Hispanic African American: No     Diabetic: No     Tobacco smoker: No     Systolic Blood Pressure: 528 mmHg     Is BP treated: No     HDL  Cholesterol: 91 mg/dL     Total Cholesterol: 215 mg/dL  Glucose:  Glucose  Date Value Ref Range Status  09/28/2017 110 (H) 65 - 99 mg/dL Final   Glucose, Bld  Date Value Ref Range Status  07/21/2015 110 (H) 65 - 99 mg/dL Final     Office Visit from 06/04/2019 in [facility]  AUDIT-C Score  3     Depression: Phq 9 is  negative Depression screen [facility] 2/9 06/04/2019 03/04/2019 01/25/2018 12/08/2017 09/28/2017  Decreased Interest 0 0 0 0 0  Down, Depressed, Hopeless 0 0 0 0 0  PHQ - 2  Score 0 0 0 0 0  Altered sleeping 0 0 0 - -  Tired, decreased energy 0 0 0 - -  Change in appetite 0 0 0 - -  Feeling bad or failure about yourself  0 0 0 - -  Trouble concentrating 0 0 0 - -  Moving slowly or fidgety/restless 0 0 0 - -  Suicidal thoughts 0 0 0 - -  PHQ-9 Score 0 0 0 - -  Difficult doing work/chores Not difficult at all Not difficult at all Not difficult at all - -   Hypertension: BP Readings from Last 3 Encounters:  06/04/19 118/76  01/26/18 120/76  12/08/17 110/70   Obesity: Wt Readings from Last 3 Encounters:  06/04/19 111 lb 8 oz (50.6 kg)  03/04/19 109 lb (49.4 kg)  01/26/18 108 lb (49 kg)   BMI Readings from Last 3 Encounters:  06/04/19 21.78 kg/m  03/04/19 21.29 kg/m  01/26/18 21.09 kg/m     Advanced Care Planning:  A voluntary discussion about advance care planning including the explanation and discussion of advance directives.   Discussed health care proxy and Living will, and the patient was able to identify a health care proxy as husband Marlei Glomski Patient does not have a living will at present time.   Social History      She  reports that she quit smoking about 12 years ago. Her smoking use included cigarettes. She smoked 0.75 packs per day. She has never used smokeless tobacco. She reports current alcohol use. She reports that she does not use drugs.       Social History   Socioeconomic History  . Marital status: Married     Spouse name: Lennette Bihari  . Number of children: 2  . Years of education: 44  . Highest education level: Associate degree: academic program  Occupational History  . Not on file  Tobacco Use  . Smoking status: Former Smoker    Packs/day: 0.75    Types: Cigarettes    Quit date: 04/19/2007    Years since quitting: 12.1  . Smokeless tobacco: Never Used  Substance and Sexual Activity  . Alcohol use: Yes    Alcohol/week: 0.0 standard drinks    Comment: occasional  . Drug use: No  . Sexual activity: Yes  Other Topics Concern  . Not on file  Social History Narrative  . Not on file   Social Determinants of Health   Financial Resource Strain:   . Difficulty of Paying Living Expenses: Not on file  Food Insecurity:   . Worried About Charity fundraiser in the Last Year: Not on file  . Ran Out of Food in the Last Year: Not on file  Transportation Needs:   . Lack of Transportation (Medical): Not on file  . Lack of Transportation (Non-Medical): Not on file  Physical Activity:   . Days of Exercise per Week: Not on file  . Minutes of Exercise per Session: Not on file  Stress:   . Feeling of Stress : Not on file  Social Connections:   . Frequency of Communication with Friends and Family: Not on file  . Frequency of Social Gatherings with Friends and Family: Not on file  . Attends Religious Services: Not on file  . Active Member of Clubs or Organizations: Not on file  . Attends Archivist Meetings: Not on file  . Marital Status: Not on file   Family History  Family Status  Relation Name Status  . Mother  Deceased  . Father  Deceased  . Brother  Alive  . Brother  Alive  . Daughter  Alive  . Son  Alive        Her family history includes Heart attack in her brother, father, and mother.       Family History  Problem Relation Age of Onset  . Heart attack Mother        Died of MI at age 1  . Heart attack Father        Died of MI at age 86  . Heart attack Brother      Patient Active Problem List   Diagnosis Date Noted  . Preventative health care 01/25/2018  . HLD (hyperlipidemia) 12/08/2017  . Abnormal liver enzymes 12/08/2017  . Macrocytosis 10/04/2017  . Situational anxiety 11/09/2015  . Acute bronchitis 07/30/2015  . Seasonal allergic rhinitis 07/30/2015  . COPD (chronic obstructive pulmonary disease) ([place]) 06/03/2015    Past Surgical History:  Procedure Laterality Date  . ABDOMINAL HYSTERECTOMY  1999  . COLONOSCOPY WITH PROPOFOL N/A 01/26/2018   Procedure: COLONOSCOPY WITH PROPOFOL;  Surgeon: Jonathon Bellows, MD;  [facility] [address] [facility] END[facility];  Service: Gastroenterology;  Laterality: N/A;    Current Outpatient Medications:  .  albuterol (PROVENTIL) (2.5 MG/3ML) 0.083% nebulizer solution, Take 3 mLs (2.5 mg total) by nebulization every 6 (six) hours as needed for wheezing or shortness of breath., Disp: 50 mL, Rfl: 0 .  albuterol (VENTOLIN HFA) 108 (90 Base) MCG/ACT inhaler, Inhale 2 puffs into the lungs every 4 (four) hours as needed for wheezing or shortness of breath., Disp: 18 g, Rfl: 2 .  aspirin EC 81 MG tablet, Take 1 tablet (81 mg total) by mouth daily., Disp: , Rfl:  .  fexofenadine-pseudoephedrine (ALLEGRA-D 24) 180-240 MG 24 hr tablet, Take 1 tablet by mouth as needed. , Disp: , Rfl:  .  fluticasone (FLONASE) 50 MCG/ACT nasal spray, Place 2 sprays into both nostrils as needed., Disp: 16 g, Rfl: 3 .  montelukast (SINGULAIR) 10 MG tablet, Take 1 tablet (10 mg total) by mouth at bedtime., Disp: 90 tablet, Rfl: 3 .  SYMBICORT 160-4.5 MCG/ACT inhaler, USE 2 INHALATIONS BY MOUTH  2 TIMES DAILY, Disp: 30.6 g, Rfl: 3 .  Tiotropium Bromide Monohydrate (SPIRIVA RESPIMAT) 2.5 MCG/ACT AERS, TAKE 2 INHALATIONS BY MOUTH DAILY, Disp: 12 g, Rfl: 3 .  zinc sulfate 220 (50 Zn) MG capsule, Take 220 mg by mouth daily., Disp: , Rfl:   No Known Allergies  Patient Care Team: Delsa Grana, PA-C as PCP - General (Family Medicine)  Review of Systems   Constitutional: Negative.  Negative for activity change, appetite change, fatigue and unexpected weight change.  HENT: Negative.   Eyes: Negative.   Respiratory: Negative.  Negative for shortness of breath.   Cardiovascular: Negative.  Negative for chest pain, palpitations and leg swelling.  Gastrointestinal: Negative.  Negative for abdominal pain and blood in stool.  Endocrine: Negative.   Genitourinary: Negative.   Musculoskeletal: Negative.  Negative for arthralgias, gait problem, joint swelling and myalgias.  Skin: Negative.  Negative for color change, pallor and rash.  Allergic/Immunologic: Negative.   Neurological: Negative.  Negative for syncope and weakness.  Hematological: Negative.   Psychiatric/Behavioral: Negative.  Negative for confusion, dysphoric mood, self-injury and suicidal ideas. The patient is not nervous/anxious.     I personally reviewed active problem list, medication list, allergies, family history, social history, health maintenance, notes from  last encounter, lab results, imaging with the patient/caregiver today.        Objective:   Vitals:  Vitals:   06/04/19 0806  BP: 118/76  Pulse: 98  Resp: 12  Temp: 97.9 F (36.6 C)  SpO2: 100%  Weight: 111 lb 8 oz (50.6 kg)  Height: 5' (1.524 m)    Body mass index is 21.78 kg/m.  Physical Exam Vitals and nursing note reviewed.  Constitutional:      General: She is not in acute distress.    Appearance: Normal appearance. She is well-developed. She is not ill-appearing, toxic-appearing or diaphoretic.     Interventions: Face mask in place.  HENT:     Head: Normocephalic and atraumatic.     Right Ear: External ear normal.     Left Ear: External ear normal.  Eyes:     General: Lids are normal. No scleral icterus.       Right eye: No discharge.        Left eye: No discharge.     Conjunctiva/sclera: Conjunctivae normal.  Neck:     Trachea: Phonation normal. No tracheal deviation.  Cardiovascular:      Rate and Rhythm: Normal rate and regular rhythm.     Pulses: Normal pulses.          Radial pulses are 2+ on the right side and 2+ on the left side.       Posterior tibial pulses are 2+ on the right side and 2+ on the left side.     Heart sounds: Heart sounds are distant. No murmur. No friction rub. No gallop.   Pulmonary:     Effort: Pulmonary effort is normal. No respiratory distress.     Breath sounds: No stridor. Examination of the right-lower field reveals decreased breath sounds. Examination of the left-lower field reveals decreased breath sounds. Decreased breath sounds present. No wheezing, rhonchi or rales.     Comments: Increased A/P diameter Chest:     Chest wall: No tenderness.  Abdominal:     General: Bowel sounds are normal. There is no distension.     Palpations: Abdomen is soft.     Tenderness: There is no abdominal tenderness. There is no guarding or rebound.  Musculoskeletal:        General: No deformity. Normal range of motion.     Cervical back: Normal range of motion and neck supple.     Right lower leg: No edema.     Left lower leg: No edema.  Lymphadenopathy:     Cervical: No cervical adenopathy.  Skin:    General: Skin is warm and dry.     Capillary Refill: Capillary refill takes less than 2 seconds.     Coloration: Skin is not jaundiced or pale.     Findings: No rash.  Neurological:     Mental Status: She is alert and oriented to person, place, and time.     Motor: No abnormal muscle tone.     Gait: Gait normal.  Psychiatric:        Speech: Speech normal.        Behavior: Behavior normal.     Fall Risk: Fall Risk  06/04/2019 03/04/2019 01/25/2018 12/08/2017 09/28/2017  Falls in the past year? 0 0 No No No  Number falls in past yr: 0 0 - - -  Injury with Fall? 0 0 - - -    Functional Status Survey: Is the patient deaf or have difficulty hearing?: No Does the  patient have difficulty seeing, even when wearing glasses/contacts?: No Does the patient  have difficulty concentrating, remembering, or making decisions?: No Does the patient have difficulty walking or climbing stairs?: No Does the patient have difficulty dressing or bathing?: No Does the patient have difficulty doing errands alone such as visiting a doctor's office or shopping?: No   Assessment & Plan:    CPE completed today  . USPSTF grade A and B recommendations reviewed with patient; age-appropriate recommendations, preventive care, screening tests, etc discussed and encouraged; healthy living encouraged; see AVS for patient education given to patient  . Discussed importance of 150 minutes of physical activity weekly, AHA exercise recommendations given to pt in AVS/handout  . Discussed importance of healthy diet:  eating lean meats and proteins, avoiding trans fats and saturated fats, avoid simple sugars and excessive carbs in diet, eat 6 servings of fruit/vegetables daily and drink plenty of water and avoid sweet beverages.    . Recommended pt to do annual eye exam and routine dental exams/cleanings  . Depression, alcohol, fall screening completed as documented above and per flowsheets  . Reviewed Health Maintenance: Health Maintenance  Topic Date Due  . MAMMOGRAM  03/23/2018  . PAP SMEAR-Modifier  06/04/2019 (Originally 02/16/1982)  . TETANUS/TDAP  06/03/2020 (Originally 02/17/1980)  . HIV Screening  06/03/2020 (Originally 02/17/1976)  . COLONOSCOPY  01/27/2023  . INFLUENZA VACCINE  Completed  . Hepatitis C Screening  Completed    . Immunizations: Immunization History  Administered Date(s) Administered  . Influenza Inj Mdck Quad Pf 02/03/2017  . Influenza, Seasonal, Injecte, Preservative Fre 01/16/2013  . Influenza,inj,Quad PF,6+ Mos 01/25/2018, 02/10/2019  . Influenza-Unspecified 01/18/2015, 01/27/2017, 02/10/2019  . Pneumococcal Polysaccharide-23 09/28/2017        ICD-10-CM   1. Adult general medical exam  Z00.00 CBC with Differential/Platelet    Lipid  panel    COMPLETE METABOLIC PANEL WITH GFR    TSH    VITAMIN D 25 Hydroxy (Vit-D Deficiency, Fractures)    TSH    Vitamin B12    Comprehensive metabolic panel    Lipid panel    CBC with Differential/Platelet  2. Chronic obstructive pulmonary disease, unspecified COPD type ([place]) Chronic J44.9 Fluticasone-Umeclidin-Vilant (TRELEGY ELLIPTA) 100-62.5-25 MCG/INH AEPB   trial of switching to trelegy  3. Moderate persistent asthma without complication  V25.36 Fluticasone-Umeclidin-Vilant (TRELEGY ELLIPTA) 100-62.5-25 MCG/INH AEPB   well controlled with singulair, Symbicort and Spiriva but patient would like to try a triple therapy inhaler  4. Seasonal allergic rhinitis due to pollen  J30.1   5. Macrocytosis  D75.89 Vitamin B12    CBC with Differential/Platelet  6. Pure hypercholesterolemia  E78.00 Comprehensive metabolic panel    Lipid panel   LDL elevated in the past, family hx in mother and father of MI, mother was young - discussed ASCVD risk score and tx with statins  7. Cough  R05 pantoprazole (PROTONIX) 20 MG tablet   Phlegm in the back of throat fairly consistent for several months - trial PPI vs trial AR/post-nasal drip tx   Patient also requested that her B12 and vitamin D be checked today states she is [employer] employee she wants to check on because the labs are free for her   Laurell Roof 06/04/19 8:19 AM  [facility]

## 2019-06-04 NOTE — Patient Instructions (Addendum)
Trial of omeprazole/prilosec (once a day 20 mg) see if it helps with congestion in back of throat  Or try antihistamine in the evening like zyrtec, claritin and see if that helps with phlegm    1,200 mg of calcium and 1000 IU of Vit D 3 daily or in divided doses Check your womens multivitamin  Preventing Osteoporosis, Adult Osteoporosis is a condition that causes the bones to lose density. This means that the bones become thinner, and the normal spaces in bone tissue become larger. Low bone density can make the bones weak and cause them to break more easily. Osteoporosis cannot always be prevented, but you can take steps to lower your risk of developing this condition. How can this condition affect me? If you develop osteoporosis, you will be more likely to break bones in your wrist, spine, or hip. Even a minor accident or injury can be enough to break weak bones. The bones will also be slower to heal. Osteoporosis can cause other problems as well, such as a stooped posture or trouble with movement. Osteoporosis can occur with aging. As you get older, you may lose bone tissue more quickly, or it may be replaced more slowly. Osteoporosis is more likely to develop if you have poor nutrition or do not get enough calcium or vitamin D. Other lifestyle factors can also play a role. By eating a well-balanced diet and making lifestyle changes, you can help keep your bones strong and healthy, lowering your chances of developing osteoporosis. What can increase my risk? The following factors may make you more likely to develop osteoporosis:  Having a family history of the condition.  Having poor nutrition or not getting enough calcium or vitamin D.  Using certain medicines, such as steroid medicines or antiseizure medicines.  Being any of the following: ? 34 years of age or older. ? Female. ? A woman who has gone through menopause (is postmenopausal). ? White (Caucasian) or of Asian  descent.  Smoking or having a history of smoking.  Not being physically active (being sedentary).  Having a small body frame. What actions can I take to prevent this?  Get enough calcium   Make sure you get enough calcium every day. Calcium is the most important mineral for bone health. Most people can get enough calcium from their diet, but supplements may be recommended for people who are at risk for osteoporosis. Follow these guidelines: ? If you are age 59 or younger, aim to get 1,000 mg of calcium every day. ? If you are older than age 59, aim to get 1,200 mg of calcium every day.  Good sources of calcium include: ? Dairy products, such as low-fat or nonfat milk, cheese, and yogurt. ? Dark green leafy vegetables, such as bok choy and broccoli. ? Foods that have had calcium added to them (calcium-fortified foods), such as orange juice, cereal, bread, soy beverages, and tofu products. ? Nuts, such as almonds.  Check nutrition labels to see how much calcium is in a food or drink. Get enough vitamin D  Try to get enough vitamin D every day. Vitamin D is the most essential vitamin for bone health. It helps the body absorb calcium. Follow these guidelines for how much vitamin D to get from food: ? If you are age 59 or younger, aim to get at least 600 international units (IU) every day. Your health care provider may suggest more. ? If you are older than age 59, aim to get at least  800 international units every day. Your health care provider may suggest more.  Good sources of vitamin D in your diet include: ? Egg yolks. ? Oily fish, such as salmon, sardines, and tuna. ? Milk and cereal fortified with vitamin D.  Your body also makes vitamin D when you are out in the sun. Exposing the bare skin on your face, arms, legs, or back to the sun for no more than 30 minutes a day, 2 times a week is more than enough. Beyond that, make sure you use sunblock to protect your skin from sunburn,  which increases your risk for skin cancer. Exercise  Stay active and get exercise every day.  Ask your health care provider what types of exercise are best for you. Weight-bearing and strength-building activities are important for building and maintaining healthy bones. Some examples of these types of activities include: ? Walking and hiking. ? Jogging and running. ? Dancing. ? Gym exercises. ? Lifting weights. ? Tennis and racquetball. ? Climbing stairs. ? Aerobics. Make other lifestyle changes  Do not use any products that contain nicotine or tobacco, such as cigarettes, e-cigarettes, and chewing tobacco. If you need help quitting, ask your health care provider.  Lose weight if you are overweight.  If you drink alcohol: ? Limit how much you use to:  0-1 drink a day for nonpregnant women.  0-2 drinks a day for men. ? Be aware of how much alcohol is in your drink. In the [place] one drink equals one 12 oz bottle of beer (355 mL), one 5 oz glass of wine (148 mL), or one 1 oz glass of hard liquor (44 mL). Where to find support If you need help making changes to prevent osteoporosis, talk with your health care provider. You can ask for a referral to a diet and nutrition specialist (dietitian) and a physical therapist. Where to find more information Learn more about osteoporosis from:  NIH Osteoporosis and Related Bone Diseases National Resource Center: www.bones.nih.gov  [place]S. Office on Women's Health: www.womenshealth.gov  National Osteoporosis Foundation: www.nof.org Summary  Osteoporosis is a condition that causes weak bones that are more likely to break.  Eat a healthy diet, making sure you get enough calcium and vitamin D, and stay active by getting regular exercise to help prevent osteoporosis.  Other ways to reduce your risk of osteoporosis include maintaining a healthy weight and avoiding alcohol and products that contain nicotine or tobacco. This information is not  intended to replace advice given to you by your health care provider. Make sure you discuss any questions you have with your health care provider. Document Revised: 11/02/2018 Document Reviewed: 11/02/2018 Elsevier Patient Education  2020 Elsevier Inc.   Preventive Care 40-64 Years Old, Female Preventive care refers to visits with your health care provider and lifestyle choices that can promote health and wellness. This includes:  A yearly physical exam. This may also be called an annual well check.  Regular dental visits and eye exams.  Immunizations.  Screening for certain conditions.  Healthy lifestyle choices, such as eating a healthy diet, getting regular exercise, not using drugs or products that contain nicotine and tobacco, and limiting alcohol use. What can I expect for my preventive care visit? Physical exam Your health care provider will check your:  Height and weight. This may be used to calculate body mass index (BMI), which tells if you are at a healthy weight.  Heart rate and blood pressure.  Skin for abnormal spots. Counseling Your health   care provider may ask you questions about your:  Alcohol, tobacco, and drug use.  Emotional well-being.  Home and relationship well-being.  Sexual activity.  Eating habits.  Work and work Statistician.  Method of birth control.  Menstrual cycle.  Pregnancy history. What immunizations do I need?  Influenza (flu) vaccine  This is recommended every year. Tetanus, diphtheria, and pertussis (Tdap) vaccine  You may need a Td booster every 10 years. Varicella (chickenpox) vaccine  You may need this if you have not been vaccinated. Zoster (shingles) vaccine  You may need this after age 60. Measles, mumps, and rubella (MMR) vaccine  You may need at least one dose of MMR if you were born in 1957 or later. You may also need a second dose. Pneumococcal conjugate (PCV13) vaccine  You may need this if you have  certain conditions and were not previously vaccinated. Pneumococcal polysaccharide (PPSV23) vaccine  You may need one or two doses if you smoke cigarettes or if you have certain conditions. Meningococcal conjugate (MenACWY) vaccine  You may need this if you have certain conditions. Hepatitis A vaccine  You may need this if you have certain conditions or if you travel or work in places where you may be exposed to hepatitis A. Hepatitis B vaccine  You may need this if you have certain conditions or if you travel or work in places where you may be exposed to hepatitis B. Haemophilus influenzae type b (Hib) vaccine  You may need this if you have certain conditions. Human papillomavirus (HPV) vaccine  If recommended by your health care provider, you may need three doses over 6 months. You may receive vaccines as individual doses or as more than one vaccine together in one shot (combination vaccines). Talk with your health care provider about the risks and benefits of combination vaccines. What tests do I need? Blood tests  Lipid and cholesterol levels. These may be checked every 5 years, or more frequently if you are over 3 years old.  Hepatitis C test.  Hepatitis B test. Screening  Lung cancer screening. You may have this screening every year starting at age 37 if you have a 30-pack-year history of smoking and currently smoke or have quit within the past 15 years.  Colorectal cancer screening. All adults should have this screening starting at age 23 and continuing until age 60. Your health care provider may recommend screening at age 37 if you are at increased risk. You will have tests every 1-10 years, depending on your results and the type of screening test.  Diabetes screening. This is done by checking your blood sugar (glucose) after you have not eaten for a while (fasting). You may have this done every 1-3 years.  Mammogram. This may be done every 1-2 years. Talk with your  health care provider about when you should start having regular mammograms. This may depend on whether you have a family history of breast cancer.  BRCA-related cancer screening. This may be done if you have a family history of breast, ovarian, tubal, or peritoneal cancers.  Pelvic exam and Pap test. This may be done every 3 years starting at age 76. Starting at age 37, this may be done every 5 years if you have a Pap test in combination with an HPV test. Other tests  Sexually transmitted disease (STD) testing.  Bone density scan. This is done to screen for osteoporosis. You may have this scan if you are at high risk for osteoporosis. Follow these instructions  at home: Eating and drinking  Eat a diet that includes fresh fruits and vegetables, whole grains, lean protein, and low-fat dairy.  Take vitamin and mineral supplements as recommended by your health care provider.  Do not drink alcohol if: ? Your health care provider tells you not to drink. ? You are pregnant, may be pregnant, or are planning to become pregnant.  If you drink alcohol: ? Limit how much you have to 0-1 drink a day. ? Be aware of how much alcohol is in your drink. In the [place] one drink equals one 12 oz bottle of beer (355 mL), one 5 oz glass of wine (148 mL), or one 1 oz glass of hard liquor (44 mL). Lifestyle  Take daily care of your teeth and gums.  Stay active. Exercise for at least 30 minutes on 5 or more days each week.  Do not use any products that contain nicotine or tobacco, such as cigarettes, e-cigarettes, and chewing tobacco. If you need help quitting, ask your health care provider.  If you are sexually active, practice safe sex. Use a condom or other form of birth control (contraception) in order to prevent pregnancy and STIs (sexually transmitted infections).  If told by your health care provider, take low-dose aspirin daily starting at age 50. What's next?  Visit your health care provider once  a year for a well check visit.  Ask your health care provider how often you should have your eyes and teeth checked.  Stay up to date on all vaccines. This information is not intended to replace advice given to you by your health care provider. Make sure you discuss any questions you have with your health care provider. Document Revised: 12/14/2017 Document Reviewed: 12/14/2017 Elsevier Patient Education  2020 Elsevier Inc.    American Heart Association (AHA) Exercise Recommendation  Being physically active is important to prevent heart disease and stroke, the nation's No. 1and No. 5killers. To improve overall cardiovascular health, we suggest at least 150 minutes per week of moderate exercise or 75 minutes per week of vigorous exercise (or a combination of moderate and vigorous activity). Thirty minutes a day, five times a week is an easy goal to remember. You will also experience benefits even if you divide your time into two or three segments of 10 to 15 minutes per day.  For people who would benefit from lowering their blood pressure or cholesterol, we recommend 40 minutes of aerobic exercise of moderate to vigorous intensity three to four times a week to lower the risk for heart attack and stroke.  Physical activity is anything that makes you move your body and burn calories.  This includes things like climbing stairs or playing sports. Aerobic exercises benefit your heart, and include walking, jogging, swimming or biking. Strength and stretching exercises are best for overall stamina and flexibility.  The simplest, positive change you can make to effectively improve your heart health is to start walking. It's enjoyable, free, easy, social and great exercise. A walking program is flexible and boasts high success rates because people can stick with it. It's easy for walking to become a regular and satisfying part of life.   For Overall Cardiovascular Health:  At least 30 minutes of  moderate-intensity aerobic activity at least 5 days per week for a total of 150  OR   At least 25 minutes of vigorous aerobic activity at least 3 days per week for a total of 75 minutes; or a combination of moderate- and   vigorous-intensity aerobic activity  AND   Moderate- to high-intensity muscle-strengthening activity at least 2 days per week for additional health benefits.  For Lowering Blood Pressure and Cholesterol  An average 40 minutes of moderate- to vigorous-intensity aerobic activity 3 or 4 times per week  What if I can't make it to the time goal? Something is always better than nothing! And everyone has to start somewhere. Even if you've been sedentary for years, today is the day you can begin to make healthy changes in your life. If you don't think you'll make it for 30 or 40 minutes, set a reachable goal for today. You can work up toward your overall goal by increasing your time as you get stronger. Don't let all-or-nothing thinking rob you of doing what you can every day.  Source:http://www.heart.org    

## 2019-06-05 LAB — VITAMIN B12: Vitamin B-12: 384 pg/mL (ref 232–1245)

## 2019-06-05 LAB — LIPID PANEL
Chol/HDL Ratio: 2.6 ratio (ref 0.0–4.4)
Cholesterol, Total: 224 mg/dL — ABNORMAL HIGH (ref 100–199)
HDL: 86 mg/dL (ref >39)
LDL Chol Calc (NIH): 130 mg/dL — ABNORMAL HIGH (ref 0–99)
Triglycerides: 46 mg/dL (ref 0–149)
VLDL Cholesterol Cal: 8 mg/dL (ref 5–40)

## 2019-06-05 LAB — CBC WITH DIFFERENTIAL/PLATELET
Basophils Absolute: 0.1 10*3/uL (ref 0.0–0.2)
Basos: 1 %
EOS (ABSOLUTE): 0.1 10*3/uL (ref 0.0–0.4)
Eos: 1 %
Hematocrit: 44.1 % (ref 34.0–46.6)
Hemoglobin: 15.4 g/dL (ref 11.1–15.9)
Immature Grans (Abs): 0 10*3/uL (ref 0.0–0.1)
Immature Granulocytes: 0 %
Lymphocytes Absolute: 1.4 10*3/uL (ref 0.7–3.1)
Lymphs: 21 %
MCH: 34.1 pg — ABNORMAL HIGH (ref 26.6–33.0)
MCHC: 34.9 g/dL (ref 31.5–35.7)
MCV: 98 fL — ABNORMAL HIGH (ref 79–97)
Monocytes Absolute: 0.4 10*3/uL (ref 0.1–0.9)
Monocytes: 6 %
Neutrophils Absolute: 4.7 10*3/uL (ref 1.4–7.0)
Neutrophils: 71 %
Platelets: 239 10*3/uL (ref 150–450)
RBC: 4.52 x10E6/uL (ref 3.77–5.28)
RDW: 13 % (ref 11.7–15.4)
WBC: 6.7 10*3/uL (ref 3.4–10.8)

## 2019-06-05 LAB — COMPREHENSIVE METABOLIC PANEL
ALT: 19 IU/L (ref 0–32)
AST: 26 IU/L (ref 0–40)
Albumin/Globulin Ratio: 1.5 (ref 1.2–2.2)
Albumin: 4.2 g/dL (ref 3.8–4.9)
Alkaline Phosphatase: 111 IU/L (ref 39–117)
BUN/Creatinine Ratio: 26 — ABNORMAL HIGH (ref 9–23)
BUN: 17 mg/dL (ref 6–24)
Bilirubin Total: 0.2 mg/dL (ref 0.0–1.2)
CO2: 24 mmol/L (ref 20–29)
Calcium: 9.3 mg/dL (ref 8.7–10.2)
Chloride: 104 mmol/L (ref 96–106)
Creatinine, Ser: 0.65 mg/dL (ref 0.57–1.00)
GFR calc Af Amer: 113 mL/min/{1.73_m2} (ref >59)
GFR calc non Af Amer: 98 mL/min/{1.73_m2} (ref >59)
Globulin, Total: 2.8 g/dL (ref 1.5–4.5)
Glucose: 108 mg/dL — ABNORMAL HIGH (ref 65–99)
Potassium: 4.1 mmol/L (ref 3.5–5.2)
Sodium: 142 mmol/L (ref 134–144)
Total Protein: 7 g/dL (ref 6.0–8.5)

## 2019-06-05 LAB — TSH: TSH: 1.05 u[IU]/mL (ref 0.450–4.500)

## 2019-06-05 LAB — VITAMIN D 25 HYDROXY (VIT D DEFICIENCY, FRACTURES): Vit D, 25-Hydroxy: 26 ng/mL — ABNORMAL LOW (ref 30.0–100.0)

## 2019-06-10 ENCOUNTER — Other Ambulatory Visit: Payer: Self-pay | Admitting: Family Medicine

## 2019-06-10 ENCOUNTER — Telehealth: Payer: Self-pay

## 2019-06-10 MED ORDER — ROSUVASTATIN CALCIUM 10 MG PO TABS: 10.0000 mg | ORAL_TABLET | Freq: Every day | ORAL | 3 refills | Status: DC

## 2019-06-10 NOTE — Telephone Encounter (Signed)
-----   Message from Delsa Grana, [place] sent at 06/10/2019  9:54 AM EST ----- Please notify pt of labwork  Patient has mildly low vitamin D and B12 is in the low end of normal  -do recommend vitamin D supplement daily for her age and for osteoporosis prevention at least 1000 IU with calcium She could supplement B complex vitamins  Her thyroid, kidney and liver function were all good   Her cholesterol remains very elevated and with her family history I recommend starting a low-dose statin medication to decrease her cardiovascular risk and decrease her cholesterol (I sent in low dose crestor for her to take if willing)

## 2019-08-08 ENCOUNTER — Other Ambulatory Visit: Payer: Self-pay | Admitting: Family Medicine

## 2019-08-08 DIAGNOSIS — R05 Cough: Secondary | ICD-10-CM

## 2019-08-08 DIAGNOSIS — R059 Cough, unspecified: Secondary | ICD-10-CM

## 2019-10-10 ENCOUNTER — Other Ambulatory Visit: Payer: Self-pay | Admitting: Family Medicine

## 2019-10-10 DIAGNOSIS — R059 Cough, unspecified: Secondary | ICD-10-CM

## 2019-11-12 ENCOUNTER — Other Ambulatory Visit: Payer: Self-pay | Admitting: Family Medicine

## 2019-11-12 DIAGNOSIS — J4540 Moderate persistent asthma, uncomplicated: Secondary | ICD-10-CM

## 2019-11-12 DIAGNOSIS — J449 Chronic obstructive pulmonary disease, unspecified: Secondary | ICD-10-CM

## 2019-11-12 MED ORDER — TRELEGY ELLIPTA 100-62.5-25 MCG/INH IN AEPB: 1.0000 | INHALATION_SPRAY | Freq: Every day | RESPIRATORY_TRACT | 3 refills | Status: DC

## 2019-11-12 NOTE — Telephone Encounter (Signed)
Change in pharmacy Requested Prescriptions  Pending Prescriptions Disp Refills  . Fluticasone-Umeclidin-Vilant (TRELEGY ELLIPTA) 100-62.5-25 MCG/INH AEPB 60 each 3    Sig: Inhale 1 puff into the lungs daily.     Off-Protocol Failed - 11/12/2019 12:40 PM      Failed - Medication not assigned to a protocol, review manually.      Passed - Valid encounter within last 12 months    Recent Outpatient Visits          5 months ago Adult general medical exam   [facility] Delsa Grana, PA-C   8 months ago Chronic obstructive pulmonary disease, unspecified COPD type [facility])   [facility] Delsa Grana, PA-C   1 year ago Preventative health care   [place], Satira Anis, MD   1 year ago Shortness of breath   [place], NP   2 years ago Chronic obstructive pulmonary disease, unspecified COPD type [facility])   [place], Satira Anis, MD      Future Appointments            In 2 weeks Delsa Grana, PA-C [facility], [facility]

## 2019-11-12 NOTE — Telephone Encounter (Signed)
Pts Rx for Fluticasone-Umeclidin-Vilant (TRELEGY ELLIPTA) 100-62.5-25 MCG/INH AEPB Was sent to [facility] last month and Pt still has not received it/ she has a coupon and would like for this Rx to be resent to local pharmacy / please advise   Send to   [place], [place] Phone:  [phone]  Fax:  [phone]

## 2019-12-02 ENCOUNTER — Ambulatory Visit: Payer: Managed Care, Other (non HMO) | Admitting: Family Medicine

## 2020-03-19 ENCOUNTER — Other Ambulatory Visit: Payer: Self-pay | Admitting: Family Medicine

## 2020-04-02 ENCOUNTER — Other Ambulatory Visit: Payer: Self-pay | Admitting: Family Medicine

## 2020-04-02 DIAGNOSIS — J449 Chronic obstructive pulmonary disease, unspecified: Secondary | ICD-10-CM

## 2020-04-15 ENCOUNTER — Other Ambulatory Visit: Payer: Self-pay | Admitting: Family Medicine

## 2020-04-15 ENCOUNTER — Encounter: Payer: Self-pay | Admitting: Family Medicine

## 2020-04-15 DIAGNOSIS — J449 Chronic obstructive pulmonary disease, unspecified: Secondary | ICD-10-CM

## 2020-04-16 ENCOUNTER — Other Ambulatory Visit: Payer: Self-pay

## 2020-04-16 DIAGNOSIS — J449 Chronic obstructive pulmonary disease, unspecified: Secondary | ICD-10-CM

## 2020-04-16 MED ORDER — SYMBICORT 160-4.5 MCG/ACT IN AERO: INHALATION_SPRAY | RESPIRATORY_TRACT | 3 refills | Status: DC

## 2020-09-11 ENCOUNTER — Other Ambulatory Visit: Payer: Self-pay | Admitting: Family Medicine

## 2020-09-11 DIAGNOSIS — R059 Cough, unspecified: Secondary | ICD-10-CM

## 2020-09-11 DIAGNOSIS — J449 Chronic obstructive pulmonary disease, unspecified: Secondary | ICD-10-CM

## 2020-10-02 ENCOUNTER — Other Ambulatory Visit: Payer: Self-pay | Admitting: Family Medicine

## 2020-10-02 DIAGNOSIS — J449 Chronic obstructive pulmonary disease, unspecified: Secondary | ICD-10-CM

## 2020-10-02 DIAGNOSIS — R059 Cough, unspecified: Secondary | ICD-10-CM

## 2020-10-02 NOTE — Telephone Encounter (Signed)
Medication: Tiotropium Bromide Monohydrate (SPIRIVA RESPIMAT) 2.5 MCG/ACT AERS [[id number]] , montelukast (SINGULAIR) 10 MG tablet , [[id number]] , pantoprazole (PROTONIX) 20 MG tablet [[id number]] , SYMBICORT 160-4.5 MCG/ACT inhaler [[id number]] ,   Has the patient contacted their pharmacy? YES  (Agent: If no, request that the patient contact the pharmacy for the refill.) (Agent: If yes, when and what did the pharmacy advise?)  Preferred Pharmacy (with phone number or street name): [facility]  ([place], [address], [address] [place], [place] [address] Phone: [phone] Fax: [phone] Hours: Not open 24 hours    Agent: Please be advised that RX refills may take up to 3 business days. We ask that you follow-up with your pharmacy.

## 2020-10-03 NOTE — Telephone Encounter (Signed)
Pt last seen in office 2/21. Sent pt message via MyChart to call office to schedule appointment for med. Refills.  Last refills: Spiriva 03/04/19 12 g  3 RF Symbicort 04/16/20 30.6 g 3 RF Singulair 03/20/20 #90 3 RF Pantoprazole 09/20/19 #90 3 RF  Requested Prescriptions  Pending Prescriptions Disp Refills   Tiotropium Bromide Monohydrate (SPIRIVA RESPIMAT) 2.5 MCG/ACT AERS 12 g 3    Sig: TAKE 2 INHALATIONS BY MOUTH DAILY      Pulmonology:  Anticholinergic Agents Failed - 10/02/2020  4:35 PM      Failed - Valid encounter within last 12 months    Recent Outpatient Visits           1 year ago Adult general medical exam   [facility] Delsa Grana, PA-C   1 year ago Chronic obstructive pulmonary disease, unspecified COPD type [facility])   [facility] Delsa Grana, PA-C   2 years ago Preventative health care   [facility] L[place], Satira Anis, MD   2 years ago Shortness of breath   [place], [place], NP   3 years ago Chronic obstructive pulmonary disease, unspecified COPD type [facility])   [facility] L[facility], Satira Anis, MD                  SYMBICORT 160-4.5 MCG/ACT inhaler 30.6 g 3    Sig: USE 2 INHALATIONS BY MOUTH  2 TIMES DAILY      Pulmonology:  Combination Products Failed - 10/02/2020  4:35 PM      Failed - Valid encounter within last 12 months    Recent Outpatient Visits           1 year ago Adult general medical exam   [facility] Delsa Grana, PA-C   1 year ago Chronic obstructive pulmonary disease, unspecified COPD type [facility])   [facility] Delsa Grana, PA-C   2 years ago Preventative health care   [facility] L[place], Satira Anis, MD   2 years ago Shortness of breath   [place], Benjamine Mola E, NP   3 years ago Chronic obstructive pulmonary disease, unspecified COPD type [facility])   [facility] Lada, Satira Anis, MD                  montelukast (SINGULAIR) 10 MG tablet 90 tablet 3    Sig: Take 1 tablet (10 mg total) by mouth at bedtime.      Pulmonology:  Leukotriene Inhibitors Failed - 10/02/2020  4:35 PM      Failed - Valid encounter within last 12 months    Recent Outpatient Visits           1 year ago Adult general medical exam   [facility] Delsa Grana, PA-C   1 year ago Chronic obstructive pulmonary disease, unspecified COPD type [facility])   [facility] Delsa Grana, PA-C   2 years ago Preventative health care   [facility] Lada, Satira Anis, MD   2 years ago Shortness of breath   [place], NP   3 years ago Chronic obstructive pulmonary disease, unspecified COPD type [facility])   [facility] Lada, Satira Anis, MD                  pantoprazole (PROTONIX) 20 MG tablet 90  tablet 3      Gastroenterology: Proton Pump Inhibitors Failed - 10/02/2020  4:35 PM      Failed - Valid encounter within last 12 months    Recent Outpatient Visits           1 year ago Adult general medical exam   [facility] Delsa Grana, PA-C   1 year ago Chronic obstructive pulmonary disease, unspecified COPD type [facility])   [facility] Delsa Grana, PA-C   2 years ago Preventative health care   [facility] Lada, Satira Anis, MD   2 years ago Shortness of breath   [place], NP   3 years ago Chronic obstructive pulmonary disease, unspecified COPD type [facility])   [place], Satira Anis, MD

## 2020-10-05 ENCOUNTER — Other Ambulatory Visit: Payer: Self-pay

## 2020-10-05 NOTE — Telephone Encounter (Signed)
Last seen 2.16.2021 no future appt

## 2020-10-13 ENCOUNTER — Encounter: Payer: Self-pay | Admitting: Family Medicine

## 2020-10-13 ENCOUNTER — Other Ambulatory Visit: Payer: Self-pay

## 2020-10-13 ENCOUNTER — Ambulatory Visit: Payer: Managed Care, Other (non HMO) | Admitting: Family Medicine

## 2020-10-13 DIAGNOSIS — J209 Acute bronchitis, unspecified: Secondary | ICD-10-CM

## 2020-10-13 DIAGNOSIS — J301 Allergic rhinitis due to pollen: Secondary | ICD-10-CM | POA: Diagnosis not present

## 2020-10-13 DIAGNOSIS — J449 Chronic obstructive pulmonary disease, unspecified: Secondary | ICD-10-CM | POA: Diagnosis not present

## 2020-10-13 DIAGNOSIS — R059 Cough, unspecified: Secondary | ICD-10-CM

## 2020-10-13 MED ORDER — SPIRIVA RESPIMAT 2.5 MCG/ACT IN AERS: INHALATION_SPRAY | RESPIRATORY_TRACT | 3 refills | Status: DC

## 2020-10-13 MED ORDER — PREDNISONE 20 MG PO TABS: 40.0000 mg | ORAL_TABLET | Freq: Every day | ORAL | 0 refills | Status: AC

## 2020-10-13 MED ORDER — FLUTICASONE PROPIONATE 50 MCG/ACT NA SUSP: 2.0000 | NASAL | 3 refills | Status: DC | PRN

## 2020-10-13 MED ORDER — SYMBICORT 160-4.5 MCG/ACT IN AERO: INHALATION_SPRAY | RESPIRATORY_TRACT | 3 refills | Status: DC

## 2020-10-13 MED ORDER — ALBUTEROL SULFATE (2.5 MG/3ML) 0.083% IN NEBU: 2.5000 mg | INHALATION_SOLUTION | Freq: Four times a day (QID) | RESPIRATORY_TRACT | 0 refills | Status: DC | PRN

## 2020-10-13 MED ORDER — ALBUTEROL SULFATE HFA 108 (90 BASE) MCG/ACT IN AERS: 2.0000 | INHALATION_SPRAY | RESPIRATORY_TRACT | 2 refills | Status: DC | PRN

## 2020-10-13 MED ORDER — PANTOPRAZOLE SODIUM 20 MG PO TBEC: DELAYED_RELEASE_TABLET | ORAL | 3 refills | Status: DC

## 2020-10-13 NOTE — Patient Instructions (Signed)
It was great to see you!  Our plans for today:  - Take the steroids as prescribed. - We sent refills of your meds to the pharmacy.  - Come back next week for recheck.   Take care and seek immediate care sooner if you develop any concerns.   Dr. Ky Barban

## 2020-10-13 NOTE — Assessment & Plan Note (Signed)
Controlled on current regimen, no changes made.

## 2020-10-13 NOTE — Progress Notes (Signed)
    SUBJECTIVE:   CHIEF COMPLAINT / HPI:   Emphysema/COPD - Medications: spiriva, symbicort, albuterol prn - Compliance: good but has ran out recently. - over the past week has had increased SOB with walking shorter distances, albuterol not helping. No change in sputum or fevers.  - sometimes wheezing. - Exacerbations in last 6 months: none - CAT score: 7  Allergies - on allegra, flonase, singulair. Good control.  OBJECTIVE:   BP 116/72   Pulse 97   Temp 97.6 F (36.4 C)   Resp 16   Ht 5\' 2"  (1.575 m)   Wt 110 lb 11.2 oz (50.2 kg)   SpO2 97%   BMI 20.25 kg/m   Gen: well appearing, in NAD Card: RRR, no LE edema. Lungs: distant breath sounds. No wheezing or crackles. Speaking in 4-5 word sentences.   ASSESSMENT/PLAN:   COPD (chronic obstructive pulmonary disease) (HCC) With likely mild exacerbation given has been out of controller inhalers recently though without wheezing, sputum change, hypoxia. Will provide steroid burst and inhaler refills. F/u next week for recheck.   Seasonal allergic rhinitis Controlled on current regimen, no changes made.    Myles Gip, DO

## 2020-10-13 NOTE — Assessment & Plan Note (Addendum)
With likely mild exacerbation given has been out of controller inhalers recently though without wheezing, sputum change, hypoxia. Will provide steroid burst and inhaler refills. F/u next week for recheck.

## 2020-10-14 ENCOUNTER — Other Ambulatory Visit: Payer: Self-pay | Admitting: Family Medicine

## 2020-10-14 DIAGNOSIS — J301 Allergic rhinitis due to pollen: Secondary | ICD-10-CM

## 2020-10-14 MED ORDER — FLUTICASONE PROPIONATE 50 MCG/ACT NA SUSP: 2.0000 | Freq: Every day | NASAL | 3 refills | Status: DC | PRN

## 2020-10-20 ENCOUNTER — Encounter: Payer: Self-pay | Admitting: Family Medicine

## 2020-10-20 ENCOUNTER — Ambulatory Visit: Payer: Managed Care, Other (non HMO) | Admitting: Family Medicine

## 2020-10-20 ENCOUNTER — Other Ambulatory Visit: Payer: Self-pay

## 2020-10-20 VITALS — BP 124/82 | HR 94 | Temp 98.0°F | Resp 16 | Ht 62.0 in | Wt 110.5 lb

## 2020-10-20 DIAGNOSIS — J441 Chronic obstructive pulmonary disease with (acute) exacerbation: Secondary | ICD-10-CM

## 2020-10-20 NOTE — Assessment & Plan Note (Signed)
Acute exacerbation much improved after steroid burst and refill of controller inhalers. Continue current maintenance therapy. F/u in 6 months or sooner if needed.

## 2020-10-20 NOTE — Patient Instructions (Signed)
It was great to see you! I am so glad you are doing much better!  Our plans for today:  - Continue your current daily inhalers. - Come back in 6 months for follow up.  Take care and seek immediate care sooner if you develop any concerns.   Dr. Ky Barban

## 2020-10-20 NOTE — Progress Notes (Signed)
    SUBJECTIVE:   CHIEF COMPLAINT / HPI:   SHORTNESS OF BREATH - seen last week for worsened SOB since out of controller inhalers. Rx steroid burst and inhaler refills. On symbicort and spiriva for COPD. - hasn't had to use albuterol since last week. - has been taking spiriva and symbicort.  - no cough.  - shortness of breath much improved. Able to walk around the block without stopping for rest.   OBJECTIVE:   BP 124/82   Pulse 94   Temp 98 F (36.7 C) (Oral)   Resp 16   Ht 5\' 2"  (1.575 m)   Wt 110 lb 8 oz (50.1 kg)   SpO2 99%   BMI 20.21 kg/m   Gen: well appearing, in NAD Card: RRR Lungs: CTAB, distant breath sounds, no wheeze, crackles.  ASSESSMENT/PLAN:   COPD (chronic obstructive pulmonary disease) (HCC) Acute exacerbation much improved after steroid burst and refill of controller inhalers. Continue current maintenance therapy. F/u in 6 months or sooner if needed.     Myles Gip, DO

## 2020-12-04 ENCOUNTER — Ambulatory Visit: Payer: Self-pay | Admitting: *Deleted

## 2020-12-04 ENCOUNTER — Telehealth: Payer: Self-pay | Admitting: Family Medicine

## 2020-12-04 NOTE — Telephone Encounter (Signed)
Informed patient provider not in office, but would forward message.  Most likely it will be denied and she would need to be re evaluated .

## 2020-12-04 NOTE — Telephone Encounter (Signed)
Called to request another 5 day dose of prednisone due to continued popping in ear, phlegm in throat and when symptoms get worse patient reports she gets anxious and becomes short of breath. Denies shortness of breath at this time. Patient will be going out of town to stay with daughter this week and would like to have prescription by tomorrow if possible . Please advise. Care advise given. Patient verbalized understanding of care advise and to go to UC if symptoms worsen. Patient would like a call back

## 2020-12-04 NOTE — Telephone Encounter (Signed)
Reason for Disposition  [1] Caller has medicine question about med NOT prescribed by PCP AND [2] triager unable to answer question (e.g., compatibility with other med, storage)  Answer Assessment - Initial Assessment Questions 1. NAME of MEDICATION: "What medicine are you calling about?"     prednisone 2. QUESTION: "What is your question?" (e.g., double dose of medicine, side effect)     Can get a 5 day course prior to going out of town  3. PRESCRIBING HCP: "Who prescribed it?" Reason: if prescribed by specialist, call should be referred to that group.     [place]  4. SYMPTOMS: "Do you have any symptoms?"     Popping in ear, phlegm in throat 5. SEVERITY: If symptoms are present, ask "Are they mild, moderate or severe?"     SOB not present now but can get severe if happens 6. PREGNANCY:  "Is there any chance that you are pregnant?" "When was your last menstrual period?"     na  Protocols used: Medication Question Call-A-AH

## 2020-12-04 NOTE — Telephone Encounter (Signed)
Please see  previous note sent

## 2020-12-04 NOTE — Telephone Encounter (Signed)
Pt was seen last by Dr. Ky Barban and is asking for another 5 day burst of prednisone / she states she is having the ear popping and phlegm again like before and wanted to get ahead of this / pt is asking for this today and knows it is last minute but wants to get it before leaving for the beach tomorrow / please advise

## 2020-12-08 NOTE — Telephone Encounter (Signed)
Left detailed vm °

## 2021-03-03 ENCOUNTER — Other Ambulatory Visit: Payer: Self-pay | Admitting: Family Medicine

## 2021-03-07 ENCOUNTER — Other Ambulatory Visit: Payer: Self-pay | Admitting: Family Medicine

## 2021-03-07 NOTE — Telephone Encounter (Signed)
[facility] doctor line is closed.

## 2021-03-08 NOTE — Telephone Encounter (Signed)
Requested Prescriptions  Pending Prescriptions Disp Refills  . montelukast (SINGULAIR) 10 MG tablet [Pharmacy Med Name: Montelukast Sodium 10 MG Oral Tablet] 90 tablet 0    Sig: TAKE 1 TABLET BY MOUTH AT  BEDTIME     Pulmonology:  Leukotriene Inhibitors Passed - 03/07/2021  5:57 PM      Passed - Valid encounter within last 12 months    Recent Outpatient Visits          4 months ago Chronic obstructive pulmonary disease with acute exacerbation [facility])   [place], DO   4 months ago Seasonal allergic rhinitis due to pollen   [place], DO   1 year ago Adult general medical exam   [facility] Delsa Grana, PA-C   2 years ago Chronic obstructive pulmonary disease, unspecified COPD type [facility])   [facility] Delsa Grana, PA-C   3 years ago Preventative health care   [place], Satira Anis, MD      Future Appointments            In 1 month Delsa Grana, PA-C [facility], [facility]

## 2021-04-22 ENCOUNTER — Ambulatory Visit: Payer: Managed Care, Other (non HMO) | Admitting: Nurse Practitioner

## 2021-04-29 ENCOUNTER — Ambulatory Visit: Payer: Managed Care, Other (non HMO) | Admitting: Nurse Practitioner

## 2021-05-02 ENCOUNTER — Other Ambulatory Visit: Payer: Self-pay | Admitting: Family Medicine

## 2021-05-13 ENCOUNTER — Ambulatory Visit: Payer: Managed Care, Other (non HMO) | Admitting: Nurse Practitioner

## 2021-07-26 ENCOUNTER — Other Ambulatory Visit: Payer: Self-pay

## 2021-07-26 DIAGNOSIS — Z1231 Encounter for screening mammogram for malignant neoplasm of breast: Secondary | ICD-10-CM

## 2021-08-17 ENCOUNTER — Telehealth: Payer: Self-pay | Admitting: Family Medicine

## 2021-08-17 DIAGNOSIS — R059 Cough, unspecified: Secondary | ICD-10-CM

## 2021-08-18 ENCOUNTER — Other Ambulatory Visit: Payer: Self-pay

## 2021-08-18 DIAGNOSIS — R059 Cough, unspecified: Secondary | ICD-10-CM

## 2021-08-18 MED ORDER — PANTOPRAZOLE SODIUM 20 MG PO TBEC: DELAYED_RELEASE_TABLET | ORAL | 0 refills | Status: DC

## 2021-08-18 NOTE — Telephone Encounter (Signed)
Pharmacy request for 1 year supply- too soon ?Rx 10/13/20 #90 3RF ?Requested Prescriptions  ?Pending Prescriptions Disp Refills  ?? pantoprazole (PROTONIX) 20 MG tablet [Pharmacy Med Name: Pantoprazole Sodium 20 MG Oral Tablet Delayed Release] 90 tablet 3  ?  Sig: TAKE 1 TABLET BY MOUTH IN  THE MORNING 1 HOUR BEFORE  BREAKFAST  ?  ? Gastroenterology: Proton Pump Inhibitors Passed - 08/17/2021 11:27 PM  ?  ?  Passed - Valid encounter within last 12 months  ?  Recent Outpatient Visits   ?      ? 10 months ago Chronic obstructive pulmonary disease with acute exacerbation ([place])  ? [place]  ? 10 months ago Seasonal allergic rhinitis due to pollen  ? [place], DO  ? 2 years ago Adult general medical exam  ? [facility] Delsa Grana, PA-C  ? 2 years ago Chronic obstructive pulmonary disease, unspecified COPD type ([place])  ? [facility] Delsa Grana, PA-C  ? 3 years ago Preventative health care  ? [facility] [place], Satira Anis, MD  ?  ?  ?Future Appointments   ?        ? In 4 weeks Delsa Grana, PA-C [facility]  ?  ? ?  ?  ?  ? ?

## 2021-08-23 ENCOUNTER — Other Ambulatory Visit: Payer: Self-pay | Admitting: Family Medicine

## 2021-08-23 DIAGNOSIS — J449 Chronic obstructive pulmonary disease, unspecified: Secondary | ICD-10-CM

## 2021-08-24 NOTE — Telephone Encounter (Signed)
Requested medication (s) are due for refill today:   Yes for both ? ?Requested medication (s) are on the active medication list:   yes for both ? ?Future visit scheduled:   Yes 09/16/2021 with Kristeen Miss ? ? ?Last ordered: Both 10/13/2020 with 3 refills ? ?Reason returned is a 1 yr. Supply is being requested.  Pt has an upcoming appt however per protocol we can only approve 90 day supply until her appt.   This is for a mail order phar so a 1 yr is being requested.    ? ?Requested Prescriptions  ?Pending Prescriptions Disp Refills  ? Tiotropium Bromide Monohydrate (SPIRIVA RESPIMAT) 2.5 MCG/ACT AERS [Pharmacy Med Name: Spiriva Respimat 2.5 MCG/ACT Inhalation Aerosol Solution] 12 g 3  ?  Sig: USE 2 INHALATIONS BY MOUTH  DAILY  ?  ? Pulmonology:  Anticholinergic Agents Passed - 08/23/2021  5:37 AM  ?  ?  Passed - Valid encounter within last 12 months  ?  Recent Outpatient Visits   ? ?      ? 10 months ago Chronic obstructive pulmonary disease with acute exacerbation ([place])  ? [place], DO  ? 10 months ago Seasonal allergic rhinitis due to pollen  ? [place], DO  ? 2 years ago Adult general medical exam  ? [facility] Delsa Grana, PA-C  ? 2 years ago Chronic obstructive pulmonary disease, unspecified COPD type ([place])  ? [facility] Delsa Grana, PA-C  ? 3 years ago Preventative health care  ? [facility] [place], Satira Anis, MD  ? ?  ?  ?Future Appointments   ? ?        ? In 3 weeks Delsa Grana, PA-C [facility] [place]  ? ?  ? ? ?  ?  ?  ? SYMBICORT 160-4.5 MCG/ACT inhaler [Pharmacy Med Name: Symbicort 160-4.5 MCG/ACT Inhalation Aerosol] 30.6 g 3  ?  Sig: USE 2 INHALATIONS BY MOUTH  TWICE DAILY  ?  ? Pulmonology:  Combination Products Passed - 08/23/2021  5:37 AM  ?  ?  Passed - Valid encounter within last 12 months  ?  Recent Outpatient Visits   ? ?      ? 10 months ago Chronic  obstructive pulmonary disease with acute exacerbation ([place])  ? [place], DO  ? 10 months ago Seasonal allergic rhinitis due to pollen  ? [place], DO  ? 2 years ago Adult general medical exam  ? [facility] Delsa Grana, PA-C  ? 2 years ago Chronic obstructive pulmonary disease, unspecified COPD type ([place])  ? [facility] Delsa Grana, PA-C  ? 3 years ago Preventative health care  ? [facility] [place], Satira Anis, MD  ? ?  ?  ?Future Appointments   ? ?        ? In 3 weeks Delsa Grana, PA-C [facility], [place]  ? ?  ? ? ?  ?  ?  ? ?

## 2021-09-16 ENCOUNTER — Ambulatory Visit (INDEPENDENT_AMBULATORY_CARE_PROVIDER_SITE_OTHER): Payer: Managed Care, Other (non HMO) | Admitting: Family Medicine

## 2021-09-16 ENCOUNTER — Encounter: Payer: Self-pay | Admitting: Family Medicine

## 2021-09-16 VITALS — BP 122/80 | HR 100 | Temp 97.8°F | Resp 14 | Ht 61.5 in | Wt 108.2 lb

## 2021-09-16 DIAGNOSIS — D692 Other nonthrombocytopenic purpura: Secondary | ICD-10-CM

## 2021-09-16 DIAGNOSIS — J302 Other seasonal allergic rhinitis: Secondary | ICD-10-CM | POA: Diagnosis not present

## 2021-09-16 DIAGNOSIS — Z1211 Encounter for screening for malignant neoplasm of colon: Secondary | ICD-10-CM

## 2021-09-16 DIAGNOSIS — E559 Vitamin D deficiency, unspecified: Secondary | ICD-10-CM

## 2021-09-16 DIAGNOSIS — R748 Abnormal levels of other serum enzymes: Secondary | ICD-10-CM

## 2021-09-16 DIAGNOSIS — J449 Chronic obstructive pulmonary disease, unspecified: Secondary | ICD-10-CM | POA: Diagnosis not present

## 2021-09-16 DIAGNOSIS — Z23 Encounter for immunization: Secondary | ICD-10-CM

## 2021-09-16 DIAGNOSIS — Z114 Encounter for screening for human immunodeficiency virus [HIV]: Secondary | ICD-10-CM

## 2021-09-16 DIAGNOSIS — J441 Chronic obstructive pulmonary disease with (acute) exacerbation: Secondary | ICD-10-CM

## 2021-09-16 DIAGNOSIS — D7589 Other specified diseases of blood and blood-forming organs: Secondary | ICD-10-CM

## 2021-09-16 DIAGNOSIS — Z0000 Encounter for general adult medical examination without abnormal findings: Secondary | ICD-10-CM | POA: Diagnosis not present

## 2021-09-16 DIAGNOSIS — F418 Other specified anxiety disorders: Secondary | ICD-10-CM

## 2021-09-16 DIAGNOSIS — J301 Allergic rhinitis due to pollen: Secondary | ICD-10-CM

## 2021-09-16 DIAGNOSIS — R4589 Other symptoms and signs involving emotional state: Secondary | ICD-10-CM

## 2021-09-16 DIAGNOSIS — E785 Hyperlipidemia, unspecified: Secondary | ICD-10-CM

## 2021-09-16 MED ORDER — ALBUTEROL SULFATE HFA 108 (90 BASE) MCG/ACT IN AERS: 2.0000 | INHALATION_SPRAY | RESPIRATORY_TRACT | 2 refills | Status: DC | PRN

## 2021-09-16 MED ORDER — FLUTICASONE PROPIONATE 50 MCG/ACT NA SUSP: 2.0000 | Freq: Every day | NASAL | 3 refills | Status: DC | PRN

## 2021-09-16 MED ORDER — PREDNISONE 20 MG PO TABS: ORAL_TABLET | ORAL | 0 refills | Status: DC

## 2021-09-16 NOTE — Patient Instructions (Signed)
Health Maintenance  Topic Date Due   Pap Smear  Never done   Mammogram  03/23/2018   COVID-19 Vaccine (2 - Pfizer series) 10/02/2021*   Zoster (Shingles) Vaccine (1 of 2) 12/17/2021*   Tetanus Vaccine  09/17/2022*   Flu Shot  11/16/2021   Colon Cancer Screening  01/27/2023   Hepatitis C Screening: USPSTF Recommendation to screen - Ages 18-61 yo.  Completed   HIV Screening  Completed   HPV Vaccine  Aged Out  *Topic was postponed. The date shown is not the original due date.   [facility],  [place]  [address] Get Driving Directions Main: [phone]

## 2021-09-16 NOTE — Progress Notes (Signed)
Patient: Brittney Doyle, Female    DOB: November 15, 1960, 61 y.o.   MRN: [id number] Delsa Grana, PA-C Visit Date: 09/16/2021  Today's Provider: Delsa Grana, PA-C   Chief Complaint  Patient presents with   Annual Exam   Subjective:   Pt presents for CPE, hasn't been seen in this office since 2021 - multiple chronic conditions and was previously on several prescriptions meds - inhalers, statins  Annual physical exam:  Brittney Doyle is a 61 y.o. female who presents today for complete physical exam:  Exercise/Activity:  not much - working from home  Diet/nutrition:  overall balanced healthy, add protein drinks Sleep:  well  SDOH Screenings   Alcohol Screen: Low Risk    Last Alcohol Screening Score (AUDIT): 0  Depression (PHQ2-9): Low Risk    PHQ-2 Score: 1  Financial Resource Strain: Low Risk    Difficulty of Paying Living Expenses: Not hard at all  Food Insecurity: No Food Insecurity   Worried About Charity fundraiser in the Last Year: Never true   [place] in the Last Year: Never true  Housing: Low Risk    Last Housing Risk Score: 0  Physical Activity: Inactive   Days of Exercise per Week: 0 days   Minutes of Exercise per Session: 20 min  Social Connections: Moderately Integrated   Frequency of Communication with Friends and Family: More than three times a week   Frequency of Social Gatherings with Friends and Family: Never   Attends Religious Services: More than 4 times per year   Active Member of [employer] or Organizations: No   Attends Music therapist: Never   Marital Status: Married  Stress: No Stress Concern Present   Feeling of Stress : Not at all  Tobacco Use: Medium Risk   Smoking Tobacco Use: Former   Smokeless Tobacco Use: Never   Passive Exposure: Not on Pensions consultant Needs: No Transportation Needs   Lack of Transportation (Medical): No   Lack of Transportation (Non-Medical): No     Pt wished to do routine f/up on chronic  conditions today in addition to CPE. COPD:  on spiriva and symbicort and uses rescue inhaler and neb, also on singulair and other over the counter allergy meds  HLD: Lab Results  Component Value Date   CHOL 224 (H) 06/04/2019   HDL 86 06/04/2019   LDLCALC 130 (H) 06/04/2019   TRIG 46 06/04/2019   CHOLHDL 2.6 06/04/2019   Was previously on crestor 10 - but not currently taking  GERD on protonix    Advised pt of separate visit billing/coding for routine f/up  USPSTF grade A and B recommendations - reviewed and addressed today  Depression:  Phq 9 completed today by patient, was reviewed by me with patient in the room PHQ score is neg, pt feels mood is good, but she is anxious about health    09/16/2021   10:30 AM 10/20/2020    9:59 AM 10/13/2020   11:22 AM 06/04/2019    8:08 AM  PHQ 2/9 Scores  PHQ - 2 Score 0 0 0 0  PHQ- 9 Score 1  0 0      09/16/2021   10:30 AM 10/20/2020    9:59 AM 10/13/2020   11:22 AM 06/04/2019    8:08 AM 03/04/2019    8:25 AM  Depression screen PHQ 2/9  Decreased Interest 0 0 0 0 0  Down, Depressed, Hopeless 0 0 0 0 0  PHQ - 2 Score 0 0 0 0 0  Altered sleeping 0  0 0 0  Tired, decreased energy 1  0 0 0  Change in appetite 0  0 0 0  Feeling bad or failure about yourself  0  0 0 0  Trouble concentrating 0  0 0 0  Moving slowly or fidgety/restless 0  0 0 0  Suicidal thoughts 0  0 0 0  PHQ-9 Score 1  0 0 0  Difficult doing work/chores Not difficult at all Not difficult at all Not difficult at all Not difficult at all Not difficult at all    Alcohol screening: [place] Office Visit from 10/20/2020 in [facility]  AUDIT-C Score 0       Immunizations and Health Maintenance: Health Maintenance  Topic Date Due   PAP SMEAR-Modifier  Never done   MAMMOGRAM  03/23/2018   COVID-19 Vaccine (2 - [place] series) 10/02/2021 (Originally 01/08/2020)   Zoster Vaccines- Shingrix (1 of 2) 12/17/2021 (Originally 02/17/2011)   TETANUS/TDAP   09/17/2022 (Originally 02/17/1980)   INFLUENZA VACCINE  11/16/2021   COLONOSCOPY (Pts 45-43yr Insurance coverage will need to be confirmed)  01/27/2023   Hepatitis C Screening  Completed   HIV Screening  Completed   HPV VACCINES  Aged Out     Hep C Screening: done  STD testing and prevention (HIV/chl/gon/syphilis):  see above, no additional testing desired by pt today - but due for HIV screen   Intimate partner violence:safe  Sexual History/Pain during Intercourse: Married  Menstrual History/LMP/Abnormal Bleeding: not sure about total vs partial - defers to next time No LMP recorded. Patient has had a hysterectomy.  Incontinence Symptoms:  none  Breast cancer:  due - previously ordered - pt needs to call to schedule Last Mammogram: *see HM list above BRCA gene screening:   Cervical cancer screening: due - partial hysterecomy previously reported managing with GYN but I see no visits in chart for many years Pt denies family hx of cancers - breast, ovarian, uterine, colon:     Osteoporosis:   Discussion on osteoporosis per age, including high calcium and vitamin D supplementation, weight bearing exercises Pt is supplementing with daily calcium/Vit D.  Skin cancer:  Hx of skin CA -  NO Discussed atypical lesions   Colorectal cancer:   Colonoscopy is UTD - due next year Discussed concerning signs and sx of CRC, pt denies blood in stool, change in bowels  Lung cancer:   Low Dose CT Chest recommended if Age 61-80years, 20 pack-year currently smoking OR have quit w/in 15years. Patient does not qualify.    Social History   Tobacco Use   Smoking status: Former    Packs/day: 0.75    Types: Cigarettes    Quit date: 04/19/2007    Years since quitting: 14.4   Smokeless tobacco: Never  Vaping Use   Vaping Use: Never used  Substance Use Topics   Alcohol use: Yes    Alcohol/week: 0.0 standard drinks    Comment: occasional   Drug use: No     Flowsheet Row [facility]C [facility] 0       Family History  Problem Relation Age of Onset   Heart attack Mother        Died of MI at age 61  Heart attack Father        Died of MI at age 61  Heart  attack Brother      Blood pressure/Hypertension: BP Readings from Last 3 Encounters:  09/16/21 122/80  10/20/20 124/82  10/13/20 116/72    Weight/Obesity: Wt Readings from Last 3 Encounters:  09/16/21 108 lb 3.2 oz (49.1 kg)  10/20/20 110 lb 8 oz (50.1 kg)  10/13/20 110 lb 11.2 oz (50.2 kg)   BMI Readings from Last 3 Encounters:  09/16/21 20.11 kg/m  10/20/20 20.21 kg/m  10/13/20 20.25 kg/m     Lipids:  Lab Results  Component Value Date   CHOL 224 (H) 06/04/2019   CHOL 215 (H) 04/20/2018   Lab Results  Component Value Date   HDL 86 06/04/2019   HDL 91 04/20/2018   Lab Results  Component Value Date   LDLCALC 130 (H) 06/04/2019   LDLCALC 108 (H) 04/20/2018   Lab Results  Component Value Date   TRIG 46 06/04/2019   TRIG 80 04/20/2018   Lab Results  Component Value Date   CHOLHDL 2.6 06/04/2019   No results found for: LDLDIRECT Based on the results of lipid panel his/her cardiovascular risk factor ( using [place] )  in the next 10 years is: The 10-year ASCVD risk score (Arnett DK, et al., 2019) is: 2.4%   Values used to calculate the score:     Age: 26 years     Sex: Female     Is Non-Hispanic African American: No     Diabetic: No     Tobacco smoker: No     Systolic Blood Pressure: 917 mmHg     Is BP treated: No     HDL Cholesterol: 86 mg/dL     Total Cholesterol: 224 mg/dL  Glucose:  Glucose  Date Value Ref Range Status  06/04/2019 108 (H) 65 - 99 mg/dL Final  09/28/2017 110 (H) 65 - 99 mg/dL Final   Glucose, Bld  Date Value Ref Range Status  07/21/2015 110 (H) 65 - 99 mg/dL Final    Advanced Care Planning:  A voluntary discussion about advance care planning including the explanation and discussion of advance  directives.   Discussed health care proxy and Living will, and the patient was able to identify a health care proxy as husband.   Patient does not have a living will at present time.   Social History       Social History   Socioeconomic History   Marital status: Married    Spouse name: Lennette Bihari   Number of children: 2   Years of education: 12   Highest education level: Associate degree: academic program  Occupational History   Not on file  Tobacco Use   Smoking status: Former    Packs/day: 0.75    Types: Cigarettes    Quit date: 04/19/2007    Years since quitting: 14.4   Smokeless tobacco: Never  Vaping Use   Vaping Use: Never used  Substance and Sexual Activity   Alcohol use: Yes    Alcohol/week: 0.0 standard drinks    Comment: occasional   Drug use: No   Sexual activity: Yes  Other Topics Concern   Not on file  Social History Narrative   Not on file   Social Determinants of Health   Financial Resource Strain: Low Risk    Difficulty of Paying Living Expenses: Not hard at all  Food Insecurity: No Food Insecurity   Worried About Charity fundraiser in the Last Year: Never true   [place] in the Last Year: Never  true  Transportation Needs: No Transportation Needs   Lack of Transportation (Medical): No   Lack of Transportation (Non-Medical): No  Physical Activity: Inactive   Days of Exercise per Week: 0 days   Minutes of Exercise per Session: 20 min  Stress: No Stress Concern Present   Feeling of Stress : Not at all  Social Connections: Moderately Integrated   Frequency of Communication with Friends and Family: More than three times a week   Frequency of Social Gatherings with Friends and Family: Never   Attends Religious Services: More than 4 times per year   Active Member of Clubs or Organizations: No   Attends Archivist Meetings: Never   Marital Status: Married    Family History        Family History  Problem Relation Age of Onset   Heart  attack Mother        Died of MI at age 58   Heart attack Father        Died of MI at age 52   Heart attack Brother     Patient Active Problem List   Diagnosis Date Noted   Purpura, nonthrombocytopenic ([place]) 06/04/2019   HLD (hyperlipidemia) 12/08/2017   Abnormal liver enzymes 12/08/2017   Macrocytosis 10/04/2017   Situational anxiety 11/09/2015   Seasonal allergic rhinitis 07/30/2015   COPD (chronic obstructive pulmonary disease) ([place]) 06/03/2015    Past Surgical History:  Procedure Laterality Date   ABDOMINAL HYSTERECTOMY  1999   for endometriosis, believes partial    COLONOSCOPY WITH PROPOFOL N/A 01/26/2018   Procedure: COLONOSCOPY WITH PROPOFOL;  Surgeon: Jonathon Bellows, MD;  Location: [facility] END[facility]COPY;  Service: Gastroenterology;  Laterality: N/A;     Current Outpatient Medications:    albuterol (PROVENTIL) (2.5 MG/3ML) 0.083% nebulizer solution, Take 3 mLs (2.5 mg total) by nebulization every 6 (six) hours as needed for wheezing or shortness of breath., Disp: 50 mL, Rfl: 0   albuterol (VENTOLIN HFA) 108 (90 Base) MCG/ACT inhaler, Inhale 2 puffs into the lungs every 4 (four) hours as needed for wheezing or shortness of breath., Disp: 18 g, Rfl: 2   aspirin EC 81 MG tablet, Take 1 tablet (81 mg total) by mouth daily., Disp: , Rfl:    fexofenadine-pseudoephedrine (ALLEGRA-D 24) 180-240 MG 24 hr tablet, Take 1 tablet by mouth as needed. , Disp: , Rfl:    fluticasone (FLONASE) 50 MCG/ACT nasal spray, Place 2 sprays into both nostrils daily as needed., Disp: 16 g, Rfl: 3   montelukast (SINGULAIR) 10 MG tablet, TAKE 1 TABLET BY MOUTH AT  BEDTIME, Disp: 90 tablet, Rfl: 3   pantoprazole (PROTONIX) 20 MG tablet, TAKE 1 TABLET BY MOUTH IN  THE MORNING 1 HOUR BEFORE  BREAKFAST, Disp: 90 tablet, Rfl: 0   SYMBICORT 160-4.5 MCG/ACT inhaler, USE 2 INHALATIONS BY MOUTH  TWICE DAILY, Disp: 30.6 g, Rfl: 3   Tiotropium Bromide Monohydrate (SPIRIVA RESPIMAT) 2.5 MCG/ACT AERS, USE 2 INHALATIONS BY  MOUTH  DAILY, Disp: 12 g, Rfl: 3   zinc sulfate 220 (50 Zn) MG capsule, Take 220 mg by mouth daily., Disp: , Rfl:    rosuvastatin (CRESTOR) 10 MG tablet, Take 1 tablet (10 mg total) by mouth at bedtime. (Patient not taking: Reported on 10/13/2020), Disp: 90 tablet, Rfl: 3  No Known Allergies  Patient Care Team: Delsa Grana, PA-C as PCP - General (Family Medicine)   Chart Review: I personally reviewed active problem list, medication list, allergies, family history, social history, health maintenance, notes  from last encounter, lab results, imaging with the patient/caregiver today.   Review of Systems  Constitutional: Negative.   HENT: Negative.    Eyes: Negative.   Respiratory: Negative.    Cardiovascular: Negative.   Gastrointestinal: Negative.   Endocrine: Negative.   Genitourinary: Negative.   Musculoskeletal: Negative.   Skin: Negative.   Allergic/Immunologic: Negative.   Neurological: Negative.   Hematological: Negative.   Psychiatric/Behavioral: Negative.    All other systems reviewed and are negative.        Objective:   Vitals:  Vitals:   09/16/21 1024  BP: 122/80  Pulse: 100  Resp: 14  Temp: 97.8 F (36.6 C)  TempSrc: Oral  SpO2: 95%  Weight: 108 lb 3.2 oz (49.1 kg)  Height: 5' 1.5" (1.562 m)    Body mass index is 20.11 kg/m.  Physical Exam Constitutional:      General: She is not in acute distress.    Appearance: Normal appearance. She is well-developed. She is not toxic-appearing or diaphoretic.  HENT:     Head: Normocephalic and atraumatic.     Right Ear: External ear normal.     Left Ear: External ear normal.     Nose: Nose normal.     Mouth/Throat:     Pharynx: Uvula midline.  Eyes:     General: Lids are normal.     Conjunctiva/sclera: Conjunctivae normal.     Pupils: Pupils are equal, round, and reactive to light.  Neck:     Trachea: Phonation normal. No tracheal deviation.  Cardiovascular:     Rate and Rhythm: Normal rate and regular  rhythm.     Pulses: Normal pulses.          Radial pulses are 2+ on the right side and 2+ on the left side.       Posterior tibial pulses are 2+ on the right side and 2+ on the left side.     Heart sounds: Normal heart sounds. No murmur heard.   No friction rub. No gallop.  Pulmonary:     Effort: Pulmonary effort is normal. No respiratory distress.     Breath sounds: Normal breath sounds. No stridor. No wheezing, rhonchi or rales.  Chest:     Chest wall: No tenderness.  Abdominal:     General: Bowel sounds are normal. There is no distension.     Palpations: Abdomen is soft.     Tenderness: There is no abdominal tenderness. There is no guarding or rebound.  Musculoskeletal:        General: No deformity. Normal range of motion.     Cervical back: Normal range of motion and neck supple.  Lymphadenopathy:     Cervical: No cervical adenopathy.  Skin:    General: Skin is warm and dry.     Capillary Refill: Capillary refill takes less than 2 seconds.     Coloration: Skin is not pale.     Findings: No rash.  Neurological:     Mental Status: She is alert and oriented to person, place, and time.     Motor: No abnormal muscle tone.     Gait: Gait normal.  Psychiatric:        Speech: Speech normal.        Behavior: Behavior normal.      Fall Risk:    09/16/2021   10:23 AM 10/20/2020    9:58 AM 10/13/2020   11:22 AM 06/04/2019    8:07 AM 03/04/2019    8:25 AM  Fall Risk   Falls in the past year? 0 0 0 0 0  Number falls in past yr:  0 0 0 0  Injury with Fall?  0 0 0 0  Risk for fall due to : No Fall Risks      Follow up Falls prevention discussed;Education provided;Falls evaluation completed Falls evaluation completed       Functional Status Survey: Is the patient deaf or have difficulty hearing?: No Does the patient have difficulty seeing, even when wearing glasses/contacts?: No Does the patient have difficulty concentrating, remembering, or making decisions?: No Does the patient  have difficulty walking or climbing stairs?: No Does the patient have difficulty dressing or bathing?: No Does the patient have difficulty doing errands alone such as visiting a doctor's office or shopping?: No   Assessment & Plan:    CPE completed today  USPSTF grade A and B recommendations reviewed with patient; age-appropriate recommendations, preventive care, screening tests, etc discussed and encouraged; healthy living encouraged; see AVS for patient education given to patient  Discussed importance of 150 minutes of physical activity weekly, AHA exercise recommendations given to pt in AVS/handout  Discussed importance of healthy diet:  eating lean meats and proteins, avoiding trans fats and saturated fats, avoid simple sugars and excessive carbs in diet, eat 6 servings of fruit/vegetables daily and drink plenty of water and avoid sweet beverages.    Recommended pt to do annual eye exam and routine dental exams/cleanings  Depression, alcohol, fall screening completed as documented above and per flowsheets  Advance Care planning information and packet discussed and offered today, encouraged pt to discuss with family members/spouse/partner/friends and complete Advanced directive packet and bring copy to office   Reviewed Health Maintenance: Health Maintenance  Topic Date Due   PAP SMEAR-Modifier  Never done   MAMMOGRAM  03/23/2018   COVID-19 Vaccine (2 - Pfizer series) 10/02/2021 (Originally 01/08/2020)   Zoster Vaccines- Shingrix (1 of 2) 12/17/2021 (Originally 02/17/2011)   TETANUS/TDAP  09/17/2022 (Originally 02/17/1980)   INFLUENZA VACCINE  11/16/2021   COLONOSCOPY (Pts 45-13yr Insurance coverage will need to be confirmed)  01/27/2023   Hepatitis C Screening  Completed   HIV Screening  Completed   HPV VACCINES  Aged Out    Immunizations: Immunization History  Administered Date(s) Administered   Influenza Inj Mdck Quad Pf 02/03/2017   Influenza, Seasonal, Injecte,  Preservative Fre 01/16/2013   Influenza,inj,Quad PF,6+ Mos 01/25/2018, 02/10/2019   Influenza-Unspecified 01/18/2015, 01/27/2017, 02/10/2019   PFIZER(Purple Top)SARS-COV-2 Vaccination 12/18/2019   Pneumococcal Polysaccharide-23 09/28/2017   Vaccines:  Shingrix: 534-64yo and ask insurance if covered when patient above 637yo Pneumonia: recommended pvc20 today  educated and discussed with patient.     ICD-10-CM   1. Adult general medical exam  Z00.00 CBC with Differential/Platelet    Comprehensive metabolic panel    Lipid panel    Hemoglobin A1c    TSH    VITAMIN D 25 Hydroxy (Vit-D Deficiency, Fractures)   CPE done today she defers pelvic exam we will request surgical summary from her OB/GYN office unclear if partial or total hysterectomy    2. Chronic obstructive pulmonary disease, unspecified COPD type ([place]  J44.9 CBC with Differential/Platelet    albuterol (VENTOLIN HFA) 108 (90 Base) MCG/ACT inhaler   Patient does not feel well controlled, steroid burst, may need to consult with pulmonology or do follow-up PFTs to reassess    3. Seasonal allergic rhinitis, unspecified trigger  J30.2 CBC with Differential/Platelet  4. Hyperlipidemia, unspecified hyperlipidemia type  E78.5 Comprehensive metabolic panel    Lipid panel   Not currently on statin medications, recheck labs and recalculate ASCVD risk score    5. Vitamin D deficiency  E55.9 Comprehensive metabolic panel    VITAMIN D 25 Hydroxy (Vit-D Deficiency, Fractures)    Parathyroid hormone, intact (no Ca)   On supplement, recheck    6. Screening for HIV without presence of risk factors  Z11.4 HIV Antibody (routine testing w rflx)    7. COPD with acute exacerbation (HCC)  J44.1 predniSONE (DELTASONE) 20 MG tablet    albuterol (VENTOLIN HFA) 108 (90 Base) MCG/ACT inhaler   Treat exacerbation with steroid burst, Mucinex, cough medicines    8. Need for pneumococcal 20-valent conjugate vaccination  Z23     9. Seasonal allergic  rhinitis due to pollen  J30.1 fluticasone (FLONASE) 50 MCG/ACT nasal spray   refills on AR/seasonal allergy medicine, encouraged pt to use 2nd gen antihistmine OTC as well for seasonal allergies    10. Macrocytosis without anemia  D75.89 B12 and Folate Panel    11. Elevated alkaline phosphatase level  R74.8 Parathyroid hormone, intact (no Ca)    12. Purpura, nonthrombocytopenic (HCC) Chronic D69.2    Senile purpura present today on exam, patient reassured    13. Anxiety about health  F41.8       Trelegy -sample given in clinic, f/up after steroid burst and switching to trelegy - will send in Rx if this works well for her, she has respiratory symptoms frequently often associated with anxiety especially going to appointments or having to do things.  She does have frequent enough symptoms that it would be worthwhile to treat with Trelegy and get some follow-up testing, also encouraged her to use her rescue inhaler when she is symptomatic if albuterol inhaler does not work on her symptoms some of her symptoms may be anxiety.   1 month follow-up on COPD and inhaler changes  CPE in 1 year     Delsa Grana, Hershal [place]ia 09/16/21 10:44 AM  [facility]

## 2021-09-17 LAB — COMPREHENSIVE METABOLIC PANEL
ALT: 33 IU/L — ABNORMAL HIGH (ref 0–32)
AST: 30 IU/L (ref 0–40)
Albumin/Globulin Ratio: 1.6 (ref 1.2–2.2)
Albumin: 4.4 g/dL (ref 3.8–4.9)
Alkaline Phosphatase: 130 IU/L — ABNORMAL HIGH (ref 44–121)
BUN/Creatinine Ratio: 13 (ref 12–28)
BUN: 8 mg/dL (ref 8–27)
Bilirubin Total: 0.6 mg/dL (ref 0.0–1.2)
CO2: 25 mmol/L (ref 20–29)
Calcium: 9.7 mg/dL (ref 8.7–10.3)
Chloride: 101 mmol/L (ref 96–106)
Creatinine, Ser: 0.61 mg/dL (ref 0.57–1.00)
Globulin, Total: 2.8 g/dL (ref 1.5–4.5)
Glucose: 103 mg/dL — ABNORMAL HIGH (ref 70–99)
Potassium: 3.9 mmol/L (ref 3.5–5.2)
Sodium: 140 mmol/L (ref 134–144)
Total Protein: 7.2 g/dL (ref 6.0–8.5)
eGFR: 102 mL/min/{1.73_m2} (ref >59)

## 2021-09-17 LAB — CBC WITH DIFFERENTIAL/PLATELET
Basophils Absolute: 0.1 10*3/uL (ref 0.0–0.2)
Basos: 1 %
EOS (ABSOLUTE): 0.1 10*3/uL (ref 0.0–0.4)
Eos: 1 %
Hematocrit: 47 % — ABNORMAL HIGH (ref 34.0–46.6)
Hemoglobin: 15.8 g/dL (ref 11.1–15.9)
Immature Grans (Abs): 0 10*3/uL (ref 0.0–0.1)
Immature Granulocytes: 0 %
Lymphocytes Absolute: 1.4 10*3/uL (ref 0.7–3.1)
Lymphs: 22 %
MCH: 33.2 pg — ABNORMAL HIGH (ref 26.6–33.0)
MCHC: 33.6 g/dL (ref 31.5–35.7)
MCV: 99 fL — ABNORMAL HIGH (ref 79–97)
Monocytes Absolute: 0.4 10*3/uL (ref 0.1–0.9)
Monocytes: 7 %
Neutrophils Absolute: 4.3 10*3/uL (ref 1.4–7.0)
Neutrophils: 69 %
Platelets: 301 10*3/uL (ref 150–450)
RBC: 4.76 x10E6/uL (ref 3.77–5.28)
RDW: 11.9 % (ref 11.7–15.4)
WBC: 6.3 10*3/uL (ref 3.4–10.8)

## 2021-09-17 LAB — LIPID PANEL
Chol/HDL Ratio: 2.3 ratio (ref 0.0–4.4)
Cholesterol, Total: 208 mg/dL — ABNORMAL HIGH (ref 100–199)
HDL: 90 mg/dL (ref >39)
LDL Chol Calc (NIH): 107 mg/dL — ABNORMAL HIGH (ref 0–99)
Triglycerides: 59 mg/dL (ref 0–149)
VLDL Cholesterol Cal: 11 mg/dL (ref 5–40)

## 2021-09-17 LAB — HIV ANTIBODY (ROUTINE TESTING W REFLEX): HIV Screen 4th Generation wRfx: NONREACTIVE

## 2021-09-17 LAB — HEMOGLOBIN A1C
Est. average glucose Bld gHb Est-mCnc: 108 mg/dL
Hgb A1c MFr Bld: 5.4 % (ref 4.8–5.6)

## 2021-09-17 LAB — TSH: TSH: 1.67 u[IU]/mL (ref 0.450–4.500)

## 2021-09-17 LAB — VITAMIN D 25 HYDROXY (VIT D DEFICIENCY, FRACTURES): Vit D, 25-Hydroxy: 81 ng/mL (ref 30.0–100.0)

## 2021-10-18 ENCOUNTER — Telehealth: Payer: Managed Care, Other (non HMO) | Admitting: Physician Assistant

## 2021-11-11 ENCOUNTER — Other Ambulatory Visit: Payer: Self-pay | Admitting: Family Medicine

## 2021-11-11 DIAGNOSIS — R059 Cough, unspecified: Secondary | ICD-10-CM

## 2021-12-28 ENCOUNTER — Encounter: Payer: Self-pay | Admitting: Family Medicine

## 2021-12-31 ENCOUNTER — Encounter: Payer: Self-pay | Admitting: Family Medicine

## 2021-12-31 ENCOUNTER — Telehealth (INDEPENDENT_AMBULATORY_CARE_PROVIDER_SITE_OTHER): Payer: Managed Care, Other (non HMO) | Admitting: Family Medicine

## 2021-12-31 VITALS — HR 98

## 2021-12-31 DIAGNOSIS — J449 Chronic obstructive pulmonary disease, unspecified: Secondary | ICD-10-CM | POA: Diagnosis not present

## 2021-12-31 DIAGNOSIS — F418 Other specified anxiety disorders: Secondary | ICD-10-CM | POA: Diagnosis not present

## 2021-12-31 MED ORDER — BUSPIRONE HCL 5 MG PO TABS: 5.0000 mg | ORAL_TABLET | Freq: Two times a day (BID) | ORAL | 1 refills | Status: DC | PRN

## 2021-12-31 MED ORDER — HYDROXYZINE HCL 10 MG PO TABS: 10.0000 mg | ORAL_TABLET | Freq: Three times a day (TID) | ORAL | 0 refills | Status: DC | PRN

## 2021-12-31 NOTE — Progress Notes (Signed)
Name: Brittney Doyle   MRN: [id number]    DOB: 06-14-1960   Date:12/31/2021       Progress Note  Subjective:    Chief Complaint  Chief Complaint  Patient presents with   Anxiety    Pt would like a medication to have in hand PRN    I connected with  Loma Sender  on 12/31/21 at  8:40 AM EDT by a video enabled telemedicine application and verified that I am speaking with the correct person using two identifiers.  I discussed the limitations of evaluation and management by telemedicine and the availability of in person appointments. The patient expressed understanding and agreed to proceed. Staff also discussed with the patient that there may be a patient responsible charge related to this service. Patient Location: home Provider Location: [facility] Additional Individuals present: none  HPI Patient presents for worsening anxiety PHQ-9 is reviewed and negative, GAD-7 reviewed and mildly positive      12/31/2021    8:35 AM 09/16/2021   10:30 AM 10/20/2020    9:59 AM  Depression screen PHQ 2/9  Decreased Interest 0 0 0  Down, Depressed, Hopeless 0 0 0  PHQ - 2 Score 0 0 0  Altered sleeping 0 0   Tired, decreased energy 0 1   Change in appetite 0 0   Feeling bad or failure about yourself  0 0   Trouble concentrating 0 0   Moving slowly or fidgety/restless 0 0   Suicidal thoughts 0 0   PHQ-9 Score 0 1   Difficult doing work/chores Not difficult at all Not difficult at all Not difficult at all      12/31/2021    8:35 AM 09/16/2021   10:30 AM  GAD 7 : Generalized Anxiety Score  Nervous, Anxious, on Edge 1 1  Control/stop worrying 1 0  Worry too much - different things 1 0  Trouble relaxing 0 0  Restless 0 0  Easily annoyed or irritable 0 0  Afraid - awful might happen 0 0  Total GAD 7 Score 3 1  Anxiety Difficulty Somewhat difficult Not difficult at all   Very anxious and excessively worried episodes happen sometimes once every couple of days sometimes 2x a day She has  taken her husbands xanax low dose 0.25 Maybe 4 episodes in the past 2 weeks  She is unsure what is triggering the episodes Sometimes she can call her husband and just hearing his voice will calm her down Sometimes she will use her own coping skills and count backwards She has talked to a therapist in the past but it was not for anxiety it was for something else  COPD-inhalers were changed, she was due to follow-up regarding this but appointment was canceled   Patient Active Problem List   Diagnosis Date Noted   Purpura, nonthrombocytopenic ([place]) 06/04/2019   HLD (hyperlipidemia) 12/08/2017   Abnormal liver enzymes 12/08/2017   Macrocytosis 10/04/2017   Situational anxiety 11/09/2015   Seasonal allergic rhinitis 07/30/2015   COPD (chronic obstructive pulmonary disease) ([place]) 06/03/2015    Social History   Tobacco Use   Smoking status: Former    Packs/day: 0.75    Types: Cigarettes    Quit date: 04/19/2007    Years since quitting: 14.7   Smokeless tobacco: Never  Substance Use Topics   Alcohol use: Yes    Alcohol/week: 0.0 standard drinks of alcohol    Comment: occasional     Current Outpatient Medications:  albuterol (PROVENTIL) (2.5 MG/3ML) 0.083% nebulizer solution, Take 3 mLs (2.5 mg total) by nebulization every 6 (six) hours as needed for wheezing or shortness of breath., Disp: 50 mL, Rfl: 0   albuterol (VENTOLIN HFA) 108 (90 Base) MCG/ACT inhaler, Inhale 2 puffs into the lungs every 4 (four) hours as needed for wheezing or shortness of breath., Disp: 18 g, Rfl: 2   aspirin EC 81 MG tablet, Take 1 tablet (81 mg total) by mouth daily., Disp: , Rfl:    fexofenadine-pseudoephedrine (ALLEGRA-D 24) 180-240 MG 24 hr tablet, Take 1 tablet by mouth as needed. , Disp: , Rfl:    fluticasone (FLONASE) 50 MCG/ACT nasal spray, Place 2 sprays into both nostrils daily as needed., Disp: 16 g, Rfl: 3   montelukast (SINGULAIR) 10 MG tablet, TAKE 1 TABLET BY MOUTH AT  BEDTIME, Disp: 90  tablet, Rfl: 3   pantoprazole (PROTONIX) 20 MG tablet, TAKE 1 TABLET BY MOUTH IN THE  MORNING 1 HOUR BEFORE BREAKFAST, Disp: 90 tablet, Rfl: 3   predniSONE (DELTASONE) 20 MG tablet, 3 tabs poqday 1-3, 2 tabs poqday 4-6, 1 tab poqday 7-9, Disp: 18 tablet, Rfl: 0   SYMBICORT 160-4.5 MCG/ACT inhaler, USE 2 INHALATIONS BY MOUTH  TWICE DAILY, Disp: 30.6 g, Rfl: 3   Tiotropium Bromide Monohydrate (SPIRIVA RESPIMAT) 2.5 MCG/ACT AERS, USE 2 INHALATIONS BY MOUTH  DAILY, Disp: 12 g, Rfl: 3   zinc sulfate 220 (50 Zn) MG capsule, Take 220 mg by mouth daily., Disp: , Rfl:   No Known Allergies  I personally reviewed active problem list, medication list, allergies, family history, social history, health maintenance, notes from last encounter, lab results, imaging with the patient/caregiver today.   Review of Systems  Constitutional: Negative.   HENT: Negative.    Eyes: Negative.   Respiratory: Negative.    Cardiovascular: Negative.   Gastrointestinal: Negative.   Endocrine: Negative.   Genitourinary: Negative.   Musculoskeletal: Negative.   Skin: Negative.   Allergic/Immunologic: Negative.   Neurological: Negative.   Hematological: Negative.   Psychiatric/Behavioral: Negative.    All other systems reviewed and are negative.     Objective:   Virtual encounter, vitals limited, only able to obtain the following Today's Vitals   12/31/21 0836  Pulse: 98   There is no height or weight on file to calculate BMI. Nursing Note and Vital Signs reviewed.  Physical Exam Vitals and nursing note reviewed.  Constitutional:      General: She is not in acute distress.    Appearance: She is not ill-appearing, toxic-appearing or diaphoretic.     Comments: Pleasant and well appearing, thin, appears stated age  Pulmonary:     Effort: Pulmonary effort is normal. No respiratory distress.  Neurological:     Mental Status: She is alert.     Comments: Appears slightly jittery, but unable to assess for  tremor over video call  Psychiatric:        Attention and Perception: Attention and perception normal.        Mood and Affect: Affect normal. Mood is anxious. Mood is not depressed or elated. Affect is not labile, blunt, flat, angry, tearful or inappropriate.        Speech: Speech normal.        Behavior: Behavior normal. Behavior is cooperative.        Thought Content: Thought content normal.        Cognition and Memory: Cognition normal.        Judgment: Judgment normal.  PE limited by virtual encounter  No results found for this or any previous visit (from the past 72 hour(s)).  Assessment and Plan:   1. Situational anxiety Anxiety is seeming more generalized and having some panic attack type episodes-having worse anxiety episodes  She has taken her husbands xanax and she requests a rescue medicine today  I explained that I do not prescribe Xanax and offered some safer options that are not controlled substances Have given her the medications below and have reviewed them thoroughly with her and also written out instructions for her which were included in her after visit summary - also noted below We will do close follow-up to see if either of these are effective for her Also strongly advised that she get established with a local therapist, psychiatrist or psychologist -option, information and resources were additionally provided and I did asked that the patient contact us if insurance needs [place] to put a referral we would like to know who she has found to go to and we can enter that and if needed  - busPIRone (BUSPAR) 5 MG tablet; Take 1-2 tablets (5-10 mg total) by mouth 2 (two) times daily as needed (anxiety symptoms).  Dispense: 60 tablet; Refill: 1 - hydrOXYzine (ATARAX) 10 MG tablet; Take 1-2 tablets (10-20 mg total) by mouth every 8 (eight) hours as needed for anxiety (or at bedtime for insomnia).  Dispense: 30 tablet; Refill: 0  2. Chronic obstructive pulmonary disease,  unspecified COPD type ([place]) We did have a plan to try Trelegy in place of her other inhalers, she was very wheezy at the time of her physical and we did a burst of steroids and were going to change her maintenance inhalers and then see if overall her breathing improved. She notes having side effects around the time that she took the steroids and started Trelegy so Trelegy was stopped, she does continue to have COPD symptoms but she does not think that they are triggering her anxiety like she previously suspected  We do still need to follow-up on COPD she states that she will try again to start Trelegy and stop her other inhalers and see if that gives her better symptom control    -Red flags and when to present for emergency care or RTC including fever >101.77F, chest pain, shortness of breath, new/worsening/un-resolving symptoms, reviewed with patient at time of visit. Follow up and care instructions discussed and provided in AVS. Please call or look up your insurance coverage for therapists, psychiatrist and or psychologist in the area You can also look up on [email or web address] www.psychologytoday.com Betterhelp.com apps on phones like talkspace Or can call local practices like [facility] in [place]  Please try Buspar 5-10 mg up to three times a day and max daily dose of 60 mg as needed for when you feel anxiety -you may want to try a 5 mg dose a few times before knowing if it will work for you and most side effects only occur the first or second time he try the medicine and then no longer occur  Hydroxyzine is a medicine we use a lot for anxiety or insomnia it has several different forms including a capsule or pill and can go by different names such as Vistaril or Atarax.  Because we use it for insomnia this medicine can cause more sleepiness you can also try this medicine and a lower dose or break the tablets in half or use if you are having anxiety and excessive worry and difficulty  sleeping  You can use either of these medications as needed. You can use BuSpar during the day and hydroxyzine at night if this works for you they are very safe to be taken during the same day but you should not need to take them at the same time (one or the other).  If you find that the amount of anxiety, severity or frequency is increasing then we should strongly consider trying a daily medication (citalopram, Lexapro, Zoloft -many other options)  With the general worsening of your symptoms I do think no matter what happens it would be good to establish with a therapist to is licensed and trained to do evaluation and cognitive behavioral therapy for anxiety type disorders, I think it will be really helpful, and an additional resource for you.   - I discussed the assessment and treatment plan with the patient. The patient was provided an opportunity to ask questions and all were answered. The patient agreed with the plan and demonstrated an understanding of the instructions.  I provided 25+ minutes of non-face-to-face time during this encounter.   Return for 1 month f/up virtual or in person up to pt .   Delsa Grana, PA-C 12/31/21 9:07 AM

## 2021-12-31 NOTE — Patient Instructions (Addendum)
Please call or look up your insurance coverage for therapists, psychiatrist and or psychologist in the area You can also look up on [email or web address] www.psychologytoday.com Betterhelp.com apps on phones like talkspace Or can call local practices like [facility] in [place]  Please try Buspar 5-10 mg up to three times a day and max daily dose of 60 mg as needed for when you feel anxiety -you may want to try a 5 mg dose a few times before knowing if it will work for you and most side effects only occur the first or second time he try the medicine and then no longer occur  Hydroxyzine is a medicine we use a lot for anxiety or insomnia it has several different forms including a capsule or pill and can go by different names such as Vistaril or Atarax.  Because we use it for insomnia this medicine can cause more sleepiness you can also try this medicine and a lower dose or break the tablets in half or use if you are having anxiety and excessive worry and difficulty sleeping  You can use either of these medications as needed. You can use BuSpar during the day and hydroxyzine at night if this works for you they are very safe to be taken during the same day but you should not need to take them at the same time (one or the other).  If you find that the amount of anxiety, severity or frequency is increasing then we should strongly consider trying a daily medication (citalopram, Lexapro, Zoloft -many other options)  With the general worsening of your symptoms I do think no matter what happens it would be good to establish with a therapist to is licensed and trained to do evaluation and cognitive behavioral therapy for anxiety type disorders, I think it will be really helpful, and an additional resource for you.    Additional places and numbers below   Here are some resources to help you if you feel you are in a mental health crisis:  [facility] - Address: [address],  [place] - Telephone: [phone]  - Hours of Operation: Sunday - Saturday - 8:00 a.m. - 8:00 p.m. - Medicaid, Medicare (Government Issued Only), BCBS, and [employer] - Pay - Crisis Management, [place], Psychiatrists on-site to provide medication management, [place], and Peer Support Care.  National Mobile Crisis: [phone] - [place] available 24 hours a day, 365 days a year. - Available for anyone of any age in [place]

## 2022-02-01 ENCOUNTER — Other Ambulatory Visit: Payer: Self-pay | Admitting: Family Medicine

## 2022-02-01 DIAGNOSIS — F418 Other specified anxiety disorders: Secondary | ICD-10-CM

## 2022-02-02 MED ORDER — HYDROXYZINE HCL 10 MG PO TABS: 10.0000 mg | ORAL_TABLET | Freq: Every evening | ORAL | 1 refills | Status: DC | PRN

## 2022-03-31 ENCOUNTER — Other Ambulatory Visit: Payer: Self-pay | Admitting: Family Medicine

## 2022-04-01 NOTE — Telephone Encounter (Signed)
Requested Prescriptions  Pending Prescriptions Disp Refills   montelukast (SINGULAIR) 10 MG tablet [Pharmacy Med Name: Montelukast Sodium 10 MG Oral Tablet] 90 tablet 3    Sig: TAKE 1 TABLET BY MOUTH AT  BEDTIME     Pulmonology:  Leukotriene Inhibitors Passed - 03/31/2022 10:15 PM      Passed - Valid encounter within last 12 months    Recent Outpatient Visits           3 months ago Situational anxiety   [facility] Delsa Grana, PA-C   6 months ago Adult general medical exam   [facility] Delsa Grana, PA-C   1 year ago Chronic obstructive pulmonary disease with acute exacerbation [facility])   [place], DO   1 year ago Seasonal allergic rhinitis due to pollen   [place], DO   2 years ago Adult general medical exam   [facility] Delsa Grana, [place]

## 2022-09-22 ENCOUNTER — Other Ambulatory Visit: Payer: Self-pay | Admitting: Family Medicine

## 2022-09-22 DIAGNOSIS — J449 Chronic obstructive pulmonary disease, unspecified: Secondary | ICD-10-CM

## 2022-09-22 NOTE — Telephone Encounter (Signed)
Pt schduled a 3 month medrefill and wants to do cpe same day and knows that may be a copay from insurance for the followup

## 2022-09-30 ENCOUNTER — Other Ambulatory Visit: Payer: Self-pay | Admitting: Family Medicine

## 2022-09-30 ENCOUNTER — Encounter: Payer: Self-pay | Admitting: Family Medicine

## 2022-09-30 DIAGNOSIS — J449 Chronic obstructive pulmonary disease, unspecified: Secondary | ICD-10-CM

## 2022-10-03 MED ORDER — SYMBICORT 160-4.5 MCG/ACT IN AERO: 2.0000 | INHALATION_SPRAY | Freq: Two times a day (BID) | RESPIRATORY_TRACT | 2 refills | Status: DC

## 2022-10-03 MED ORDER — SPIRIVA RESPIMAT 2.5 MCG/ACT IN AERS
INHALATION_SPRAY | RESPIRATORY_TRACT | 2 refills | Status: DC
Start: 2022-10-03 — End: 2022-12-20

## 2022-10-13 ENCOUNTER — Other Ambulatory Visit: Payer: Self-pay | Admitting: Nurse Practitioner

## 2022-10-13 DIAGNOSIS — R059 Cough, unspecified: Secondary | ICD-10-CM

## 2022-10-17 NOTE — Telephone Encounter (Signed)
Requested Prescriptions  Pending Prescriptions Disp Refills   pantoprazole (PROTONIX) 20 MG tablet [Pharmacy Med Name: Pantoprazole Sodium 20 MG Oral Tablet Delayed Release] 90 tablet 0    Sig: TAKE 1 TABLET BY MOUTH IN THE  MORNING 1 HOUR BEFORE BREAKFAST     Gastroenterology: Proton Pump Inhibitors Passed - 10/13/2022 10:20 PM      Passed - Valid encounter within last 12 months    Recent Outpatient Visits           9 months ago Situational anxiety   [facility] Danelle Berry, PA-C   1 year ago Adult general medical exam   [facility] Danelle Berry, PA-C   1 year ago Chronic obstructive pulmonary disease with acute exacerbation [facility])   [facility] Caro [place], DO   2 years ago Seasonal allergic rhinitis due to pollen   [facility] Caro Laroche, DO   3 years ago Adult general medical exam   [facility] Danelle Berry, PA-C       Future Appointments             In 2 months Danelle Berry, PA-C [facility]

## 2022-12-05 ENCOUNTER — Other Ambulatory Visit: Payer: Self-pay | Admitting: Family Medicine

## 2022-12-05 DIAGNOSIS — J449 Chronic obstructive pulmonary disease, unspecified: Secondary | ICD-10-CM

## 2022-12-06 ENCOUNTER — Other Ambulatory Visit: Payer: Self-pay | Admitting: Family Medicine

## 2022-12-06 DIAGNOSIS — F418 Other specified anxiety disorders: Secondary | ICD-10-CM

## 2022-12-07 ENCOUNTER — Other Ambulatory Visit: Payer: Self-pay | Admitting: Family Medicine

## 2022-12-07 DIAGNOSIS — F418 Other specified anxiety disorders: Secondary | ICD-10-CM

## 2022-12-07 MED ORDER — HYDROXYZINE HCL 10 MG PO TABS
10.0000 mg | ORAL_TABLET | Freq: Every evening | ORAL | 0 refills | Status: DC | PRN
Start: 2022-12-07 — End: 2022-12-23

## 2022-12-07 NOTE — Telephone Encounter (Signed)
Medication Refill - Medication: hydrOXYzine HCl 10 MG  Pt called in states she can't come in sooner than September for her appt for refill. She state she is aware that she will be billed for physical and refill for her appt 09/06. She just needs her refill asap.   Has the patient contacted their pharmacy? yes (Agent: If no, request that the patient contact the pharmacy for the refill. If patient does not wish to contact the pharmacy document the reason why and proceed with request.) (Agent: If yes, when and what did the pharmacy advise?)contact pcp  Preferred Pharmacy (with phone number or street name): [id number] [place], [place] - [address] AT [facility] [address] [place], [place] [address] Phone: [phone]  Fax: [phone]  Has the patient been seen for an appointment in the last year OR does the patient have an upcoming appointment? yes  Agent: Please be advised that RX refills may take up to 3 business days. We ask that you follow-up with your pharmacy.

## 2022-12-07 NOTE — Telephone Encounter (Signed)
Pt notified- verbalized understanding.

## 2022-12-07 NOTE — Telephone Encounter (Signed)
Pt has already made an appt for 12/23/22 for CPE/Med Refill. She would like to get enough until appt time at least. I verbalized to patient Brittney Doyle is currently out of the office can not guarantee her she will get her medicine refilled due to asking another provider and has not been seen by other providers in office. Please advice

## 2022-12-18 ENCOUNTER — Other Ambulatory Visit: Payer: Self-pay | Admitting: Family Medicine

## 2022-12-18 DIAGNOSIS — J449 Chronic obstructive pulmonary disease, unspecified: Secondary | ICD-10-CM

## 2022-12-20 ENCOUNTER — Other Ambulatory Visit: Payer: Self-pay | Admitting: Family Medicine

## 2022-12-20 DIAGNOSIS — R059 Cough, unspecified: Secondary | ICD-10-CM

## 2022-12-23 ENCOUNTER — Encounter: Payer: Self-pay | Admitting: Family Medicine

## 2022-12-23 ENCOUNTER — Ambulatory Visit (INDEPENDENT_AMBULATORY_CARE_PROVIDER_SITE_OTHER): Payer: Managed Care, Other (non HMO) | Admitting: Family Medicine

## 2022-12-23 VITALS — BP 132/82 | HR 99 | Temp 97.5°F | Resp 16 | Ht 59.5 in | Wt 104.4 lb

## 2022-12-23 DIAGNOSIS — Z1231 Encounter for screening mammogram for malignant neoplasm of breast: Secondary | ICD-10-CM | POA: Diagnosis not present

## 2022-12-23 DIAGNOSIS — R748 Abnormal levels of other serum enzymes: Secondary | ICD-10-CM

## 2022-12-23 DIAGNOSIS — J301 Allergic rhinitis due to pollen: Secondary | ICD-10-CM | POA: Diagnosis not present

## 2022-12-23 DIAGNOSIS — Z23 Encounter for immunization: Secondary | ICD-10-CM

## 2022-12-23 DIAGNOSIS — Z780 Asymptomatic menopausal state: Secondary | ICD-10-CM

## 2022-12-23 DIAGNOSIS — J449 Chronic obstructive pulmonary disease, unspecified: Secondary | ICD-10-CM

## 2022-12-23 DIAGNOSIS — Z0000 Encounter for general adult medical examination without abnormal findings: Secondary | ICD-10-CM

## 2022-12-23 DIAGNOSIS — F418 Other specified anxiety disorders: Secondary | ICD-10-CM

## 2022-12-23 DIAGNOSIS — Z760 Encounter for issue of repeat prescription: Secondary | ICD-10-CM

## 2022-12-23 DIAGNOSIS — D7589 Other specified diseases of blood and blood-forming organs: Secondary | ICD-10-CM | POA: Diagnosis not present

## 2022-12-23 DIAGNOSIS — Z1211 Encounter for screening for malignant neoplasm of colon: Secondary | ICD-10-CM

## 2022-12-23 MED ORDER — HYDROXYZINE HCL 10 MG PO TABS: 10.0000 mg | ORAL_TABLET | Freq: Every evening | ORAL | 1 refills | Status: DC | PRN

## 2022-12-23 MED ORDER — ALBUTEROL SULFATE HFA 108 (90 BASE) MCG/ACT IN AERS
2.0000 | INHALATION_SPRAY | RESPIRATORY_TRACT | 2 refills | Status: DC | PRN
Start: 2022-12-23 — End: 2023-11-01

## 2022-12-23 MED ORDER — FLUTICASONE PROPIONATE 50 MCG/ACT NA SUSP
2.0000 | Freq: Every day | NASAL | 3 refills | Status: DC | PRN
Start: 2022-12-23 — End: 2023-11-01

## 2022-12-23 MED ORDER — SPIRIVA RESPIMAT 2.5 MCG/ACT IN AERS
INHALATION_SPRAY | RESPIRATORY_TRACT | 1 refills | Status: DC
Start: 2022-12-23 — End: 2023-11-01

## 2022-12-23 MED ORDER — ALBUTEROL SULFATE (2.5 MG/3ML) 0.083% IN NEBU
2.5000 mg | INHALATION_SOLUTION | Freq: Four times a day (QID) | RESPIRATORY_TRACT | 0 refills | Status: DC | PRN
Start: 2022-12-23 — End: 2024-01-01

## 2022-12-23 MED ORDER — SYMBICORT 160-4.5 MCG/ACT IN AERO
2.0000 | INHALATION_SPRAY | Freq: Two times a day (BID) | RESPIRATORY_TRACT | 1 refills | Status: DC
Start: 2022-12-23 — End: 2023-07-14

## 2022-12-23 MED ORDER — PANTOPRAZOLE SODIUM 20 MG PO TBEC
DELAYED_RELEASE_TABLET | ORAL | 1 refills | Status: DC
Start: 2022-12-23 — End: 2023-06-13

## 2022-12-23 MED ORDER — SYMBICORT 160-4.5 MCG/ACT IN AERO
2.0000 | INHALATION_SPRAY | Freq: Two times a day (BID) | RESPIRATORY_TRACT | 0 refills | Status: DC
Start: 2022-12-23 — End: 2022-12-23

## 2022-12-23 NOTE — Patient Instructions (Addendum)
Health Maintenance  Topic Date Due   DTaP/Tdap/Td vaccine (1 - Tdap) Never done   Mammogram  03/23/2018   Flu Shot  11/17/2022   Colon Cancer Screening  01/27/2023   Pap Smear  12/23/2022*   COVID-19 Vaccine (2 - 2023-24 season) 01/08/2023*   Zoster (Shingles) Vaccine (1 of 2) 03/24/2023*   Hepatitis C Screening  Completed   HIV Screening  Completed   HPV Vaccine  Aged Out  *Topic was postponed. The date shown is not the original due date.  Call to schedule your bone density and mammogram  [facility] at [facility] [address], [place], [place] [address] Scheduling phone #: [phone]  Preventive Care 62-75 Years Old, Female Preventive care refers to lifestyle choices and visits with your health care provider that can promote health and wellness. Preventive care visits are also called wellness exams. What can I expect for my preventive care visit? Counseling Your health care provider may ask you questions about your: Medical history, including: Past medical problems. Family medical history. Pregnancy history. Current health, including: Menstrual cycle. Method of birth control. Emotional well-being. Home life and relationship well-being. Sexual activity and sexual health. Lifestyle, including: Alcohol, nicotine or tobacco, and drug use. Access to firearms. Diet, exercise, and sleep habits. Work and work Astronomer. Sunscreen use. Safety issues such as seatbelt and bike helmet use. Physical exam Your health care provider will check your: Height and weight. These may be used to calculate your BMI (body mass index). BMI is a measurement that tells if you are at a healthy weight. Waist circumference. This measures the distance around your waistline. This measurement also tells if you are at a healthy weight and may help predict your risk of certain diseases, such as type 2 diabetes and high blood pressure. Heart rate and blood pressure. Body  temperature. Skin for abnormal spots. What immunizations do I need?  Vaccines are usually given at various ages, according to a schedule. Your health care provider will recommend vaccines for you based on your age, medical history, and lifestyle or other factors, such as travel or where you work. What tests do I need? Screening Your health care provider may recommend screening tests for certain conditions. This may include: Lipid and cholesterol levels. Diabetes screening. This is done by checking your blood sugar (glucose) after you have not eaten for a while (fasting). Pelvic exam and Pap test. Hepatitis B test. Hepatitis C test. HIV (human immunodeficiency virus) test. STI (sexually transmitted infection) testing, if you are at risk. Lung cancer screening. Colorectal cancer screening. Mammogram. Talk with your health care provider about when you should start having regular mammograms. This may depend on whether you have a family history of breast cancer. BRCA-related cancer screening. This may be done if you have a family history of breast, ovarian, tubal, or peritoneal cancers. Bone density scan. This is done to screen for osteoporosis. Talk with your health care provider about your test results, treatment options, and if necessary, the need for more tests. Follow these instructions at home: Eating and drinking  Eat a diet that includes fresh fruits and vegetables, whole grains, lean protein, and low-fat dairy products. Take vitamin and mineral supplements as recommended by your health care provider. Do not drink alcohol if: Your health care provider tells you not to drink. You are pregnant, may be pregnant, or are planning to become pregnant. If you drink alcohol: Limit how much you have to 0-1 drink a day. Know  how much alcohol is in your drink. In the [place] one drink equals one 12 oz bottle of beer (355 mL), one 5 oz glass of wine (148 mL), or one 1 oz glass of hard liquor (44  mL). Lifestyle Brush your teeth every morning and night with fluoride toothpaste. Floss one time each day. Exercise for at least 30 minutes 5 or more days each week. Do not use any products that contain nicotine or tobacco. These products include cigarettes, chewing tobacco, and vaping devices, such as e-cigarettes. If you need help quitting, ask your health care provider. Do not use drugs. If you are sexually active, practice safe sex. Use a condom or other form of protection to prevent STIs. If you do not wish to become pregnant, use a form of birth control. If you plan to become pregnant, see your health care provider for a prepregnancy visit. Take aspirin only as told by your health care provider. Make sure that you understand how much to take and what form to take. Work with your health care provider to find out whether it is safe and beneficial for you to take aspirin daily. Find healthy ways to manage stress, such as: Meditation, yoga, or listening to music. Journaling. Talking to a trusted person. Spending time with friends and family. Minimize exposure to UV radiation to reduce your risk of skin cancer. Safety Always wear your seat belt while driving or riding in a vehicle. Do not drive: If you have been drinking alcohol. Do not ride with someone who has been drinking. When you are tired or distracted. While texting. If you have been using any mind-altering substances or drugs. Wear a helmet and other protective equipment during sports activities. If you have firearms in your house, make sure you follow all gun safety procedures. Seek help if you have been physically or sexually abused. What's next? Visit your health care provider once a year for an annual wellness visit. Ask your health care provider how often you should have your eyes and teeth checked. Stay up to date on all vaccines. This information is not intended to replace advice given to you by your health care  provider. Make sure you discuss any questions you have with your health care provider. Document Revised: 09/30/2020 Document Reviewed: 09/30/2020 Elsevier Patient Education  2024 [employer].

## 2022-12-23 NOTE — Progress Notes (Signed)
Patient: Brittney Doyle, Female    DOB: 03-29-1961, 62 y.o.   MRN: [id number] Danelle Berry, PA-C Visit Date: 12/23/2022  Today's Provider: Danelle Berry, PA-C   Chief Complaint  Patient presents with   Annual Exam   Subjective:   Annual physical exam:  Brittney Doyle is a 62 y.o. female who presents today for complete physical exam:  Exercise/Activity:  not active at all  Diet/nutrition:  healthy Sleep: sleeps well   SDOH Screenings   Food Insecurity: No Food Insecurity (12/23/2022)  Housing: Low Risk  (12/23/2022)  Transportation Needs: No Transportation Needs (12/23/2022)  Utilities: Not At Risk (12/23/2022)  Alcohol Screen: Low Risk  (12/23/2022)  Depression (PHQ2-9): Low Risk  (12/23/2022)  Financial Resource Strain: Low Risk  (12/23/2022)  Physical Activity: Inactive (12/23/2022)  Social Connections: Moderately Isolated (12/23/2022)  Stress: No Stress Concern Present (12/23/2022)  Tobacco Use: Medium Risk (12/23/2022)  Health Literacy: Adequate Health Literacy (12/23/2022)   Pt wished to do routine f/up on chronic conditions today in addition to CPE. Advised pt of separate visit billing/coding  Multiple conditions she wishes to discuss at length, plus prior abnormal labs and worsening anxiety/anxiety attacks  Hyperlipidemia: Not on meds Last Lipids: Lab Results  Component Value Date   CHOL 208 (H) 09/16/2021   HDL 90 09/16/2021   LDLCALC 107 (H) 09/16/2021   TRIG 59 09/16/2021   CHOLHDL 2.3 09/16/2021   - : Chest pain, shortness of breath, myalgias, claudication   COPD poorly controlled, previously tried trelegy but she was having such bad anxiety at the time she couldn't tell if her respiratory sx were better or worse On symbicort and spiriva At least a couple bad exacerbations a year tx with nebs and steroids, no pulm management or recent PFTs  Losing weight - very innactive no change to diet, she states less muscle mass, eats pescatarian diet  Anxiety/mood She has tried  buspar and hydroxyzine (helps her sleep at night)  Severe panic for example coming to doctors appointment, if she misplaces her phone at home    12/23/2022    8:50 AM 12/31/2021    8:35 AM 09/16/2021   10:30 AM  GAD 7 : Generalized Anxiety Score  Nervous, Anxious, on Edge 1 1 1   Control/stop worrying 1 1 0  Worry too much - different things 1 1 0  Trouble relaxing 1 0 0  Restless 1 0 0  Easily annoyed or irritable 0 0 0  Afraid - awful might happen 0 0 0  Total GAD 7 Score 5 3 1   Anxiety Difficulty Somewhat difficult Somewhat difficult Not difficult at all      12/23/2022    8:45 AM 12/31/2021    8:35 AM 09/16/2021   10:30 AM  Depression screen PHQ 2/9  Decreased Interest 0 0 0  Down, Depressed, Hopeless 0 0 0  PHQ - 2 Score 0 0 0  Altered sleeping 0 0 0  Tired, decreased energy 0 0 1  Change in appetite 0 0 0  Feeling bad or failure about yourself  0 0 0  Trouble concentrating 0 0 0  Moving slowly or fidgety/restless 0 0 0  Suicidal thoughts 0 0 0  PHQ-9 Score 0 0 1  Difficult doing work/chores Not difficult at all Not difficult at all Not difficult at all      USPSTF grade A and B recommendations - reviewed and addressed today  Depression:  Phq 9 completed today by patient, was reviewed  by me with patient in the room PHQ score is neg, pt feels good overall still dealing with anxiety     12/23/2022    8:45 AM 12/31/2021    8:35 AM 09/16/2021   10:30 AM 10/20/2020    9:59 AM  PHQ 2/9 Scores  PHQ - 2 Score 0 0 0 0  PHQ- 9 Score 0 0 1       12/23/2022    8:45 AM 12/31/2021    8:35 AM 09/16/2021   10:30 AM 10/20/2020    9:59 AM 10/13/2020   11:22 AM  Depression screen PHQ 2/9  Decreased Interest 0 0 0 0 0  Down, Depressed, Hopeless 0 0 0 0 0  PHQ - 2 Score 0 0 0 0 0  Altered sleeping 0 0 0  0  Tired, decreased energy 0 0 1  0  Change in appetite 0 0 0  0  Feeling bad or failure about yourself  0 0 0  0  Trouble concentrating 0 0 0  0  Moving slowly or fidgety/restless 0 0 0   0  Suicidal thoughts 0 0 0  0  PHQ-9 Score 0 0 1  0  Difficult doing work/chores Not difficult at all Not difficult at all Not difficult at all Not difficult at all Not difficult at all    Alcohol screening: Flowsheet Row Office Visit from 12/23/2022 in [facility]  AUDIT-C Score 1       Immunizations and Health Maintenance: Health Maintenance  Topic Date Due   DTaP/Tdap/Td (1 - Tdap) Never done   MAMMOGRAM  03/23/2018   INFLUENZA VACCINE  11/17/2022   Colonoscopy  01/27/2023   PAP SMEAR-Modifier  12/23/2022 (Originally 02/16/1982)   COVID-19 Vaccine (2 - 2023-24 season) 01/08/2023 (Originally 12/18/2022)   Zoster Vaccines- Shingrix (1 of 2) 03/24/2023 (Originally 02/17/2011)   Hepatitis C Screening  Completed   HIV Screening  Completed   HPV VACCINES  Aged Out     Hep C Screening:  done   STD testing and prevention (HIV/chl/gon/syphilis):  see above, no additional testing desired by pt today:    none  Intimate partner violence:denies  Sexual History/Pain during Intercourse: Married, denies any sx  Menstrual History/LMP/Abnormal Bleeding: none No LMP recorded. Patient has had a hysterectomy.  Incontinence Symptoms: none  Breast cancer: due  Last Mammogram: *see HM list above  Cervical cancer screening: n/a  Osteoporosis:   Discussion on osteoporosis per age, including high calcium and vitamin D supplementation, weight bearing exercises Pt is supplementing with daily Vit D with calcium - doesn't   Skin cancer:  Hx of skin CA -  NO Discussed atypical lesions   Colorectal cancer:   Colonoscopy is due   Discussed concerning signs and sx of CRC, pt denies change in bowels or abdominal complaints  Lung cancer:   Low Dose CT Chest recommended if Age 74-80 years, 20 pack-year currently smoking OR have quit w/in 15years. Patient does not qualify.    Social History   Tobacco Use   Smoking status: Former    Current packs/day: 0.00    Types:  Cigarettes    Quit date: 04/19/2007    Years since quitting: 15.6   Smokeless tobacco: Never  Vaping Use   Vaping status: Never Used  Substance Use Topics   Alcohol use: Yes    Comment: occasional   Drug use: Never     Flowsheet Row Office Visit from 12/23/2022 in [facility]  AUDIT-C Score 1       Family History  Problem Relation Age of Onset   Heart attack Mother        Died of MI at age 50   Heart attack Father        Died of MI at age 2   Asthma Father    Heart attack Brother    Diabetes Son      Blood pressure/Hypertension: BP Readings from Last 3 Encounters:  12/23/22 132/82  09/16/21 122/80  10/20/20 124/82    Weight/Obesity: Wt Readings from Last 3 Encounters:  12/23/22 104 lb 6.4 oz (47.4 kg)  09/16/21 108 lb 3.2 oz (49.1 kg)  10/20/20 110 lb 8 oz (50.1 kg)   BMI Readings from Last 3 Encounters:  12/23/22 20.73 kg/m  09/16/21 20.11 kg/m  10/20/20 20.21 kg/m     Lipids:  Lab Results  Component Value Date   CHOL 208 (H) 09/16/2021   CHOL 224 (H) 06/04/2019   CHOL 215 (H) 04/20/2018   Lab Results  Component Value Date   HDL 90 09/16/2021   HDL 86 06/04/2019   HDL 91 04/20/2018   Lab Results  Component Value Date   LDLCALC 107 (H) 09/16/2021   LDLCALC 130 (H) 06/04/2019   LDLCALC 108 (H) 04/20/2018   Lab Results  Component Value Date   TRIG 59 09/16/2021   TRIG 46 06/04/2019   TRIG 80 04/20/2018   Lab Results  Component Value Date   CHOLHDL 2.3 09/16/2021   CHOLHDL 2.6 06/04/2019   No results found for: "LDLDIRECT" Based on the results of lipid panel his/her cardiovascular risk factor ( using [place] Cohort )  in the next 10 years is: The 10-year ASCVD risk score (Arnett DK, et al., 2019) is: 2.9%   Values used to calculate the score:     Age: 58 years     Sex: Female     Is Non-Hispanic African American: No     Diabetic: No     Tobacco smoker: No     Systolic Blood Pressure: 132 mmHg     Is BP  treated: No     HDL Cholesterol: 90 mg/dL     Total Cholesterol: 208 mg/dL  Glucose:  Glucose  Date Value Ref Range Status  09/16/2021 103 (H) 70 - 99 mg/dL Final  16/01/9603 540 (H) 65 - 99 mg/dL Final  98/02/9146 829 (H) 65 - 99 mg/dL Final   Glucose, Bld  Date Value Ref Range Status  07/21/2015 110 (H) 65 - 99 mg/dL Final    Advanced Care Planning:  A voluntary discussion about advance care planning including the explanation and discussion of advance directives.   Discussed health care proxy and Living will, and the patient was able to identify a health care proxy as spouse.   Patient does not have a living will at present time.   Social History       Social History   Socioeconomic History   Marital status: Married    Spouse name: Caryn Bee   Number of children: 2   Years of education: 12   Highest education level: Associate degree: academic program  Occupational History   Not on file  Tobacco Use   Smoking status: Former    Current packs/day: 0.00    Types: Cigarettes    Quit date: 04/19/2007    Years since quitting: 15.6   Smokeless tobacco: Never  Vaping Use   Vaping status: Never  Used  Substance and Sexual Activity   Alcohol use: Yes    Comment: occasional   Drug use: Never   Sexual activity: Yes    Birth control/protection: None  Other Topics Concern   Not on file  Social History Narrative   Not on file   Social Determinants of Health   Financial Resource Strain: Low Risk  (12/23/2022)   Overall Financial Resource Strain (CARDIA)    Difficulty of Paying Living Expenses: Not hard at all  Food Insecurity: No Food Insecurity (12/23/2022)   Hunger Vital Sign    Worried About Running Out of Food in the Last Year: Never true    Ran Out of Food in the Last Year: Never true  Transportation Needs: No Transportation Needs (12/23/2022)   PRAPARE - Administrator, Civil Service (Medical): No    Lack of Transportation (Non-Medical): No  Physical Activity:  Inactive (12/23/2022)   Exercise Vital Sign    Days of Exercise per Week: 0 days    Minutes of Exercise per Session: 0 min  Stress: No Stress Concern Present (12/23/2022)   [employer] of Occupational Health - Occupational Stress Questionnaire    Feeling of Stress : Only a little  Social Connections: Moderately Isolated (12/23/2022)   Social Connection and Isolation Panel [NHANES]    Frequency of Communication with Friends and Family: More than three times a week    Frequency of Social Gatherings with Friends and Family: More than three times a week    Attends Religious Services: Never    Database administrator or Organizations: No    Attends Engineer, structural: Never    Marital Status: Married    Family History        Family History  Problem Relation Age of Onset   Heart attack Mother        Died of MI at age 6   Heart attack Father        Died of MI at age 39   Asthma Father    Heart attack Brother    Diabetes Son     Patient Active Problem List   Diagnosis Date Noted   Purpura, nonthrombocytopenic (HCC) 06/04/2019   HLD (hyperlipidemia) 12/08/2017   Abnormal liver enzymes 12/08/2017   Macrocytosis 10/04/2017   Situational anxiety 11/09/2015   Seasonal allergic rhinitis 07/30/2015   COPD (chronic obstructive pulmonary disease) (HCC) 06/03/2015    Past Surgical History:  Procedure Laterality Date   ABDOMINAL HYSTERECTOMY  1999   for endometriosis, believes partial    COLONOSCOPY WITH PROPOFOL N/A 01/26/2018   Procedure: COLONOSCOPY WITH PROPOFOL;  Surgeon: Wyline Mood, MD;  Location: [facility] ENDOSCOPY;  Service: Gastroenterology;  Laterality: N/A;     Current Outpatient Medications:    albuterol (PROVENTIL) (2.5 MG/3ML) 0.083% nebulizer solution, Take 3 mLs (2.5 mg total) by nebulization every 6 (six) hours as needed for wheezing or shortness of breath., Disp: 50 mL, Rfl: 0   albuterol (VENTOLIN HFA) 108 (90 Base) MCG/ACT inhaler, Inhale 2 puffs into the  lungs every 4 (four) hours as needed for wheezing or shortness of breath., Disp: 18 g, Rfl: 2   aspirin EC 81 MG tablet, Take 1 tablet (81 mg total) by mouth daily., Disp: , Rfl:    busPIRone (BUSPAR) 5 MG tablet, Take 1-2 tablets (5-10 mg total) by mouth 2 (two) times daily as needed (anxiety symptoms)., Disp: 60 tablet, Rfl: 1   fexofenadine-pseudoephedrine (ALLEGRA-D 24) 180-240 MG 24 hr tablet,  Take 1 tablet by mouth as needed. , Disp: , Rfl:    fluticasone (FLONASE) 50 MCG/ACT nasal spray, Place 2 sprays into both nostrils daily as needed., Disp: 16 g, Rfl: 3   hydrOXYzine (ATARAX) 10 MG tablet, Take 1-2 tablets (10-20 mg total) by mouth at bedtime as needed for anxiety (or at bedtime for insomnia)., Disp: 60 tablet, Rfl: 0   montelukast (SINGULAIR) 10 MG tablet, TAKE 1 TABLET BY MOUTH AT  BEDTIME, Disp: 90 tablet, Rfl: 3   pantoprazole (PROTONIX) 20 MG tablet, TAKE 1 TABLET BY MOUTH IN THE  MORNING 1 HOUR BEFORE BREAKFAST, Disp: 90 tablet, Rfl: 0   predniSONE (DELTASONE) 20 MG tablet, 3 tabs poqday 1-3, 2 tabs poqday 4-6, 1 tab poqday 7-9, Disp: 18 tablet, Rfl: 0   SYMBICORT 160-4.5 MCG/ACT inhaler, INHALE 2 INHALATIONS BY MOUTH  INTO THE LUNGS IN THE MORNING  AND AT BEDTIME, Disp: 30.6 g, Rfl: 3   Tiotropium Bromide Monohydrate (SPIRIVA RESPIMAT) 2.5 MCG/ACT AERS, USE 2 INHALATAIONS ONCE DAILY, Disp: 12 g, Rfl: 3   zinc sulfate 220 (50 Zn) MG capsule, Take 220 mg by mouth daily., Disp: , Rfl:   No Known Allergies  Patient Care Team: Danelle Berry, PA-C as PCP - General (Family Medicine)   Chart Review: I personally reviewed active problem list, medication list, allergies, family history, social history, health maintenance, notes from last encounter, lab results, imaging with the patient/caregiver today.   Review of Systems  Constitutional: Negative.   HENT: Negative.    Eyes: Negative.   Respiratory: Negative.    Cardiovascular: Negative.   Gastrointestinal: Negative.   Endocrine:  Negative.   Genitourinary: Negative.   Musculoskeletal: Negative.   Skin: Negative.   Allergic/Immunologic: Negative.   Neurological: Negative.   Hematological: Negative.   Psychiatric/Behavioral: Negative.    All other systems reviewed and are negative.         Objective:   Vitals:  Vitals:   12/23/22 0848  BP: 132/82  Pulse: 99  Resp: 16  Temp: (!) 97.5 F (36.4 C)  TempSrc: Oral  SpO2: 97%  Weight: 104 lb 6.4 oz (47.4 kg)  Height: 4' 11.5" (1.511 m)    Body mass index is 20.73 kg/m.  Physical Exam Vitals and nursing note reviewed.  Constitutional:      General: She is not in acute distress.    Appearance: Normal appearance. She is well-developed. She is not ill-appearing, toxic-appearing or diaphoretic.     Comments: Thin appearing with BMI in normal range  HENT:     Head: Normocephalic and atraumatic.     Right Ear: External ear normal.     Left Ear: External ear normal.     Nose: Nose normal.     Mouth/Throat:     Pharynx: Uvula midline.  Eyes:     General: Lids are normal. No scleral icterus.       Right eye: No discharge.        Left eye: No discharge.     Conjunctiva/sclera: Conjunctivae normal.  Neck:     Trachea: Phonation normal. No tracheal deviation.  Cardiovascular:     Rate and Rhythm: Normal rate and regular rhythm.     Pulses: Normal pulses.          Radial pulses are 2+ on the right side and 2+ on the left side.       Posterior tibial pulses are 2+ on the right side and 2+ on the left side.  Heart sounds: Normal heart sounds. No murmur heard.    No friction rub. No gallop.  Pulmonary:     Effort: Pulmonary effort is normal. No respiratory distress.     Breath sounds: Normal breath sounds. No stridor. No wheezing, rhonchi or rales.  Abdominal:     General: Bowel sounds are normal. There is no distension.     Palpations: Abdomen is soft.     Tenderness: There is no guarding.  Musculoskeletal:     Cervical back: Normal range of  motion.  Skin:    General: Skin is warm and dry.     Capillary Refill: Capillary refill takes less than 2 seconds.     Coloration: Skin is not pale.     Findings: No rash.  Neurological:     Mental Status: She is alert and oriented to person, place, and time.     Motor: No abnormal muscle tone.     Gait: Gait normal.  Psychiatric:        Attention and Perception: Attention normal.        Mood and Affect: Mood is anxious.        Speech: Speech normal.        Behavior: Behavior normal. Behavior is cooperative.       Fall Risk:    12/23/2022    8:45 AM 12/31/2021    8:34 AM 09/16/2021   10:23 AM 10/20/2020    9:58 AM 10/13/2020   11:22 AM  Fall Risk   Falls in the past year? 0 0 0 0 0  Number falls in past yr: 0 0  0 0  Injury with Fall? 0 0  0 0  Risk for fall due to : No Fall Risks No Fall Risks No Fall Risks    Follow up Falls prevention discussed;Education provided;Falls evaluation completed Falls prevention discussed;Education provided Falls prevention discussed;Education provided;Falls evaluation completed Falls evaluation completed     Functional Status Survey: Is the patient deaf or have difficulty hearing?: No Does the patient have difficulty seeing, even when wearing glasses/contacts?: No Does the patient have difficulty concentrating, remembering, or making decisions?: No Does the patient have difficulty walking or climbing stairs?: No Does the patient have difficulty dressing or bathing?: No Does the patient have difficulty doing errands alone such as visiting a doctor's office or shopping?: No   Assessment & Plan:    CPE completed today  USPSTF grade A and B recommendations reviewed with patient; age-appropriate recommendations, preventive care, screening tests, etc discussed and encouraged; healthy living encouraged; see AVS for patient education given to patient  Discussed importance of 150 minutes of physical activity weekly, AHA exercise recommendations given  to pt in AVS/handout  Discussed importance of healthy diet:  eating lean meats and proteins, avoiding trans fats and saturated fats, avoid simple sugars and excessive carbs in diet, eat 6 servings of fruit/vegetables daily and drink plenty of water and avoid sweet beverages.    Recommended pt to do annual eye exam and routine dental exams/cleanings  Depression, alcohol, fall screening completed as documented above and per flowsheets  Advance Care planning information and packet discussed and offered today, encouraged pt to discuss with family members/spouse/partner/friends and complete Advanced directive packet and bring copy to office   Reviewed Health Maintenance: Health Maintenance  Topic Date Due   DTaP/Tdap/Td (1 - Tdap) Never done   MAMMOGRAM  03/23/2018   INFLUENZA VACCINE  11/17/2022   Colonoscopy  01/27/2023   PAP SMEAR-Modifier  12/23/2022 (  Originally 02/16/1982)   COVID-19 Vaccine (2 - 2023-24 season) 01/08/2023 (Originally 12/18/2022)   Zoster Vaccines- Shingrix (1 of 2) 03/24/2023 (Originally 02/17/2011)   Hepatitis C Screening  Completed   HIV Screening  Completed   HPV VACCINES  Aged Out    Immunizations: Immunization History  Administered Date(s) Administered   Influenza Inj Mdck Quad Pf 02/03/2017   Influenza, Seasonal, Injecte, Preservative Fre 01/16/2013   Influenza,inj,Quad PF,6+ Mos 01/25/2018, 02/10/2019   Influenza-Unspecified 01/18/2015, 01/27/2017, 02/10/2019   PFIZER(Purple Top)SARS-COV-2 Vaccination 12/18/2019   Pneumococcal Polysaccharide-23 09/28/2017   Vaccines:  Shingrix: due 59-64 yo and ask insurance if covered when patient above 62 yo Pneumonia: one done educated and discussed with patient. Flu: due educated and discussed with patient. Tdap looks due    ICD-10-CM   1. Annual physical exam  Z00.00 CBC with Differential/Platelet    Comprehensive metabolic panel    2. Encounter for screening mammogram for malignant neoplasm of breast  Z12.31 MM  3D SCREENING MAMMOGRAM BILATERAL BREAST    3. Chronic obstructive pulmonary disease, unspecified COPD type (HCC)  J44.9 Tiotropium Bromide Monohydrate (SPIRIVA RESPIMAT) 2.5 MCG/ACT AERS    albuterol (VENTOLIN HFA) 108 (90 Base) MCG/ACT inhaler    albuterol (PROVENTIL) (2.5 MG/3ML) 0.083% nebulizer solution    SYMBICORT 160-4.5 MCG/ACT inhaler    DISCONTINUED: SYMBICORT 160-4.5 MCG/ACT inhaler   overall not well controlled, discussed further eval and management - pulm/PFTs, she wishes to stay on current inhalers    4. Situational anxiety  F41.8 hydrOXYzine (ATARAX) 10 MG tablet   worse sx, severe panic and worry with simple things, unknown triggers, severe sx, discussion wtih pt about considering trying SSRI/SNRI + psych/therapy  Discussed options like lexapro, celexa, effexor - and trying them daily for 2-3 months to see the effect, getting in with psych or BH specialists/therapist for more intense CBT for anxiety/panic etc She is currently using hydroxyzine  She tried buspar in the past and it worked for her for a short amount of time but then she started to have some SE GAD 7 mildly positive, PHQ9 neg, sx reported more severe than the screenings capture    12/23/2022    8:45 AM 12/31/2021    8:35 AM 09/16/2021   10:30 AM  Depression screen PHQ 2/9  Decreased Interest 0 0 0  Down, Depressed, Hopeless 0 0 0  PHQ - 2 Score 0 0 0  Altered sleeping 0 0 0  Tired, decreased energy 0 0 1  Change in appetite 0 0 0  Feeling bad or failure about yourself  0 0 0  Trouble concentrating 0 0 0  Moving slowly or fidgety/restless 0 0 0  Suicidal thoughts 0 0 0  PHQ-9 Score 0 0 1  Difficult doing work/chores Not difficult at all Not difficult at all Not difficult at all      12/23/2022    8:50 AM 12/31/2021    8:35 AM 09/16/2021   10:30 AM  GAD 7 : Generalized Anxiety Score  Nervous, Anxious, on Edge 1 1 1   Control/stop worrying 1 1 0  Worry too much - different things 1 1 0  Trouble relaxing 1 0  0  Restless 1 0 0  Easily annoyed or irritable 0 0 0  Afraid - awful might happen 0 0 0  Total GAD 7 Score 5 3 1   Anxiety Difficulty Somewhat difficult Somewhat difficult Not difficult at all        5. Seasonal allergic rhinitis due to pollen  J30.1 fluticasone (FLONASE) 50 MCG/ACT nasal spray   refills on AR/seasonal allergy medicine, encouraged pt to use 2nd gen antihistmine OTC as well for seasonal allergies    6. Macrocytosis  D75.89 CBC with Differential/Platelet    B12 and Folate Panel   reordered the f/up labs previously ordered but never completed    7. Elevated alkaline phosphatase level  R74.8 Comprehensive metabolic panel    Parathyroid hormone, intact (no Ca)   recheck and added on PTH    8. Screening for malignant neoplasm of colon  Z12.11 Ambulatory referral to Gastroenterology   due in october for 5 year f/up    9. Postmenopausal estrogen deficiency  Z78.0 DG Bone Density   recommend she do dexa due to hx and low weight plus additional risk factors    10. Need for Tdap vaccination  Z23    due    11. Need for influenza vaccination  Z23    due    12. Medication refill  Z76.0 pantoprazole (PROTONIX) 20 MG tablet      Return in about 6 months (around 06/22/2023) for routine COPD/weight/anxiety, sooner if needed.   pt need sooner f/up for further discussion of SSRI/SNRI/psych issues and esp if she starts new medication would need a ~6 week f/up  Danelle Berry, PA-C 12/23/22 9:04 AM  [facility]

## 2023-01-02 ENCOUNTER — Encounter: Payer: Self-pay | Admitting: *Deleted

## 2023-02-11 ENCOUNTER — Other Ambulatory Visit: Payer: Self-pay | Admitting: Family Medicine

## 2023-03-07 ENCOUNTER — Encounter: Payer: Self-pay | Admitting: Family Medicine

## 2023-03-08 NOTE — Progress Notes (Deleted)
There were no vitals taken for this visit.   Subjective:    Patient ID: Brittney Doyle, female    DOB: 08/19/1960, 62 y.o.   MRN: [id number]  HPI: Brittney Doyle is a 62 y.o. female  No chief complaint on file.   Relevant past medical, surgical, family and social history reviewed and updated as indicated. Interim medical history since our last visit reviewed. Allergies and medications reviewed and updated.  Review of Systems  Per HPI unless specifically indicated above     Objective:    There were no vitals taken for this visit.  Wt Readings from Last 3 Encounters:  12/23/22 104 lb 6.4 oz (47.4 kg)  09/16/21 108 lb 3.2 oz (49.1 kg)  10/20/20 110 lb 8 oz (50.1 kg)    Physical Exam  Results for orders placed or performed in visit on 09/16/21  CBC with Differential/Platelet  Result Value Ref Range   WBC 6.3 3.4 - 10.8 x10E3/uL   RBC 4.76 3.77 - 5.28 x10E6/uL   Hemoglobin 15.8 11.1 - 15.9 g/dL   Hematocrit 13.2 (H) 44.0 - 46.6 %   MCV 99 (H) 79 - 97 fL   MCH 33.2 (H) 26.6 - 33.0 pg   MCHC 33.6 31.5 - 35.7 g/dL   RDW 10.2 72.5 - 36.6 %   Platelets 301 150 - 450 x10E3/uL   Neutrophils 69 Not Estab. %   Lymphs 22 Not Estab. %   Monocytes 7 Not Estab. %   Eos 1 Not Estab. %   Basos 1 Not Estab. %   Neutrophils Absolute 4.3 1.4 - 7.0 x10E3/uL   Lymphocytes Absolute 1.4 0.7 - 3.1 x10E3/uL   Monocytes Absolute 0.4 0.1 - 0.9 x10E3/uL   EOS (ABSOLUTE) 0.1 0.0 - 0.4 x10E3/uL   Basophils Absolute 0.1 0.0 - 0.2 x10E3/uL   Immature Granulocytes 0 Not Estab. %   Immature Grans (Abs) 0.0 0.0 - 0.1 x10E3/uL  Comprehensive metabolic panel  Result Value Ref Range   Glucose 103 (H) 70 - 99 mg/dL   BUN 8 8 - 27 mg/dL   Creatinine, Ser 4.40 0.57 - 1.00 mg/dL   eGFR 347 >42 VZ/DGL/8.75   BUN/Creatinine Ratio 13 12 - 28   Sodium 140 134 - 144 mmol/L   Potassium 3.9 3.5 - 5.2 mmol/L   Chloride 101 96 - 106 mmol/L   CO2 25 20 - 29 mmol/L   Calcium 9.7 8.7 - 10.3 mg/dL   Total  Protein 7.2 6.0 - 8.5 g/dL   Albumin 4.4 3.8 - 4.9 g/dL   Globulin, Total 2.8 1.5 - 4.5 g/dL   Albumin/Globulin Ratio 1.6 1.2 - 2.2   Bilirubin Total 0.6 0.0 - 1.2 mg/dL   Alkaline Phosphatase 130 (H) 44 - 121 IU/L   AST 30 0 - 40 IU/L   ALT 33 (H) 0 - 32 IU/L  Lipid panel  Result Value Ref Range   Cholesterol, Total 208 (H) 100 - 199 mg/dL   Triglycerides 59 0 - 149 mg/dL   HDL 90 >64 mg/dL   VLDL Cholesterol Cal 11 5 - 40 mg/dL   LDL Chol Calc (NIH) 332 (H) 0 - 99 mg/dL   Chol/HDL Ratio 2.3 0.0 - 4.4 ratio  Hemoglobin A1c  Result Value Ref Range   Hgb A1c MFr Bld 5.4 4.8 - 5.6 %   Est. average glucose Bld gHb Est-mCnc 108 mg/dL  TSH  Result Value Ref Range   TSH 1.670 0.450 - 4.500 uIU/mL  HIV Antibody (routine testing w rflx)  Result Value Ref Range   HIV Screen 4th Generation wRfx Non Reactive Non Reactive  VITAMIN D 25 Hydroxy (Vit-D Deficiency, Fractures)  Result Value Ref Range   Vit D, 25-Hydroxy 81.0 30.0 - 100.0 ng/mL      Assessment & Plan:   Problem List Items Addressed This Visit   None    Follow up plan: No follow-ups on file.

## 2023-03-09 ENCOUNTER — Ambulatory Visit: Payer: Managed Care, Other (non HMO) | Admitting: Nurse Practitioner

## 2023-06-12 ENCOUNTER — Other Ambulatory Visit: Payer: Self-pay | Admitting: Family Medicine

## 2023-06-12 DIAGNOSIS — Z760 Encounter for issue of repeat prescription: Secondary | ICD-10-CM

## 2023-07-14 ENCOUNTER — Other Ambulatory Visit: Payer: Self-pay | Admitting: Family Medicine

## 2023-07-14 DIAGNOSIS — J449 Chronic obstructive pulmonary disease, unspecified: Secondary | ICD-10-CM

## 2023-08-19 ENCOUNTER — Other Ambulatory Visit: Payer: Self-pay | Admitting: Family Medicine

## 2023-08-19 DIAGNOSIS — Z760 Encounter for issue of repeat prescription: Secondary | ICD-10-CM

## 2023-08-22 NOTE — Telephone Encounter (Signed)
 Patient will need a office visit for additional refills. Requested Prescriptions  Pending Prescriptions Disp Refills   pantoprazole  (PROTONIX ) 20 MG tablet [Pharmacy Med Name: Pantoprazole  Sodium 20 MG Oral Tablet Delayed Release] 90 tablet 1    Sig: TAKE 1 TABLET BY MOUTH IN THE  MORNING 1 HOUR BEFORE BREAKFAST     Gastroenterology: Proton Pump Inhibitors Failed - 08/22/2023 12:02 PM      Failed - Valid encounter within last 12 months    Recent Outpatient Visits   None

## 2023-09-30 ENCOUNTER — Other Ambulatory Visit: Payer: Self-pay | Admitting: Family Medicine

## 2023-09-30 DIAGNOSIS — J449 Chronic obstructive pulmonary disease, unspecified: Secondary | ICD-10-CM

## 2023-10-03 NOTE — Telephone Encounter (Signed)
 Unable to refill per protocol, courtesy refill already given, OV needed.  Requested Prescriptions  Pending Prescriptions Disp Refills   SYMBICORT  160-4.5 MCG/ACT inhaler [Pharmacy Med Name: Symbicort  160-4.5 MCG/ACT Inhalation Aerosol] 30.6 g 3    Sig: USE 2 INHALATIONS BY MOUTH TWICE DAILY     Pulmonology:  Combination Products Failed - 10/03/2023 12:58 PM      Failed - Valid encounter within last 12 months    Recent Outpatient Visits   None

## 2023-10-27 ENCOUNTER — Encounter: Payer: Self-pay | Admitting: Family Medicine

## 2023-11-01 ENCOUNTER — Telehealth (INDEPENDENT_AMBULATORY_CARE_PROVIDER_SITE_OTHER): Admitting: Family Medicine

## 2023-11-01 ENCOUNTER — Encounter: Payer: Self-pay | Admitting: Family Medicine

## 2023-11-01 VITALS — Ht 59.0 in | Wt 95.0 lb

## 2023-11-01 DIAGNOSIS — F418 Other specified anxiety disorders: Secondary | ICD-10-CM | POA: Diagnosis not present

## 2023-11-01 DIAGNOSIS — J302 Other seasonal allergic rhinitis: Secondary | ICD-10-CM | POA: Diagnosis not present

## 2023-11-01 DIAGNOSIS — J449 Chronic obstructive pulmonary disease, unspecified: Secondary | ICD-10-CM

## 2023-11-01 DIAGNOSIS — F40298 Other specified phobia: Secondary | ICD-10-CM | POA: Diagnosis not present

## 2023-11-01 MED ORDER — SYMBICORT 160-4.5 MCG/ACT IN AERO: 2.0000 | INHALATION_SPRAY | Freq: Two times a day (BID) | RESPIRATORY_TRACT | 1 refills | Status: DC

## 2023-11-01 MED ORDER — ALBUTEROL SULFATE HFA 108 (90 BASE) MCG/ACT IN AERS: 2.0000 | INHALATION_SPRAY | RESPIRATORY_TRACT | 2 refills | Status: AC | PRN

## 2023-11-01 MED ORDER — SPIRIVA RESPIMAT 2.5 MCG/ACT IN AERS: INHALATION_SPRAY | RESPIRATORY_TRACT | 1 refills | Status: DC

## 2023-11-01 NOTE — Progress Notes (Signed)
 Name: Brittney Doyle   MRN: [id number]    DOB: Jul 16, 1960   Date:11/01/2023       Progress Note  Subjective:    Chief Complaint  Chief Complaint  Patient presents with   Medical Management of Chronic Issues   COPD   Gastroesophageal Reflux   Anxiety    I connected with  Jenkins JINNY Bowers  on 11/01/23 at  3:40 PM EDT by a video enabled telemedicine application and verified that I am speaking with the correct person using two identifiers.  I discussed the limitations of evaluation and management by telemedicine and the availability of in person appointments. The patient expressed understanding and agreed to proceed. Staff also discussed with the patient that there may be a patient responsible charge related to this service. Patient Location: home Provider Location: [facility] clinic Additional Individuals present: none  HPI Virtual visit for inhaler refills for COPD  No recent exacerbations  Inhaler rescue - not getting proair  any more and the new albuterol  inahler isn't working as well for her    Pt doing therapy and working with psychiatrist they have changed meds zoloft 25 mg BID and hydrozyxine   [facility] psychiatry  - telehealth doing her meds - med list updated   Patient Active Problem List   Diagnosis Date Noted   Purpura, nonthrombocytopenic (HCC) 06/04/2019   HLD (hyperlipidemia) 12/08/2017   Abnormal liver enzymes 12/08/2017   Macrocytosis 10/04/2017   Situational anxiety 11/09/2015   Seasonal allergic rhinitis 07/30/2015   COPD (chronic obstructive pulmonary disease) (HCC) 06/03/2015    Social History   Tobacco Use   Smoking status: Former    Current packs/day: 0.00    Types: Cigarettes    Quit date: 04/19/2007    Years since quitting: 16.5   Smokeless tobacco: Never  Substance Use Topics   Alcohol use: Yes    Comment: occasional     Current Outpatient Medications:    albuterol  (PROVENTIL ) (2.5 MG/3ML) 0.083% nebulizer solution, Take 3 mLs (2.5 mg total)  by nebulization every 6 (six) hours as needed for wheezing or shortness of breath., Disp: 50 mL, Rfl: 0   albuterol  (VENTOLIN  HFA) 108 (90 Base) MCG/ACT inhaler, Inhale 2 puffs into the lungs every 4 (four) hours as needed for wheezing or shortness of breath., Disp: 18 g, Rfl: 2   busPIRone  (BUSPAR ) 5 MG tablet, Take 1-2 tablets (5-10 mg total) by mouth 2 (two) times daily as needed (anxiety symptoms)., Disp: 60 tablet, Rfl: 1   hydrOXYzine  (ATARAX ) 10 MG tablet, Take 1-2 tablets (10-20 mg total) by mouth at bedtime as needed for anxiety (or at bedtime for insomnia)., Disp: 180 tablet, Rfl: 1   montelukast  (SINGULAIR ) 10 MG tablet, TAKE 1 TABLET BY MOUTH AT  BEDTIME, Disp: 90 tablet, Rfl: 3   pantoprazole  (PROTONIX ) 20 MG tablet, TAKE 1 TABLET BY MOUTH IN THE  MORNING 1 HOUR BEFORE BREAKFAST, Disp: 90 tablet, Rfl: 1   SYMBICORT  160-4.5 MCG/ACT inhaler, USE 2 INHALATIONS BY MOUTH TWICE DAILY, Disp: 30.6 g, Rfl: 0   Tiotropium Bromide  Monohydrate (SPIRIVA  RESPIMAT) 2.5 MCG/ACT AERS, USE 2 INHALATAIONS ONCE DAILY, Disp: 12 g, Rfl: 1   zinc sulfate 220 (50 Zn) MG capsule, Take 220 mg by mouth daily., Disp: , Rfl:    aspirin EC 81 MG tablet, Take 1 tablet (81 mg total) by mouth daily. (Patient not taking: Reported on 11/01/2023), Disp: , Rfl:    fexofenadine-pseudoephedrine (ALLEGRA-D 24) 180-240 MG 24 hr tablet, Take 1  tablet by mouth as needed.  (Patient not taking: Reported on 11/01/2023), Disp: , Rfl:    fluticasone  (FLONASE ) 50 MCG/ACT nasal spray, Place 2 sprays into both nostrils daily as needed. (Patient not taking: Reported on 11/01/2023), Disp: 16 g, Rfl: 3   predniSONE  (DELTASONE ) 20 MG tablet, 3 tabs poqday 1-3, 2 tabs poqday 4-6, 1 tab poqday 7-9, Disp: 18 tablet, Rfl: 0  No Known Allergies  I personally reviewed active problem list, medication list, allergies, family history, social history, health maintenance, notes from last encounter, lab results, imaging with the patient/caregiver  today.   Review of Systems  Constitutional: Negative.   HENT: Negative.    Eyes: Negative.   Respiratory: Negative.    Cardiovascular: Negative.   Gastrointestinal: Negative.   Endocrine: Negative.   Genitourinary: Negative.   Musculoskeletal: Negative.   Skin: Negative.   Allergic/Immunologic: Negative.   Neurological: Negative.   Hematological: Negative.   Psychiatric/Behavioral: Negative.    All other systems reviewed and are negative.     Objective:   Virtual encounter, vitals limited, only able to obtain the following Today's Vitals   11/01/23 1446  Weight: 95 lb (43.1 kg)  Height: 4' 11 (1.499 m)   Body mass index is 19.19 kg/m. Nursing Note and Vital Signs reviewed.  Physical Exam Vitals and nursing note reviewed.  Pulmonary:     Effort: Pulmonary effort is normal.  Neurological:     Mental Status: She is alert.  Psychiatric:        Mood and Affect: Mood normal.        Behavior: Behavior normal.     PE limited by virtual encounter  No results found for this or any previous visit (from the past 72 hours).  Assessment and Plan:   Chronic obstructive pulmonary disease, unspecified COPD type (HCC) Assessment & Plan: No recent exacerbations requiring steroids, but she is having some SOB episodes and has been afraid to use her rescue inhaler SOB is associated with severe anxiety Recommend trial of albuterol  when not anxious or SOB to remember how she feels with albuterol  SE so this is not an additional anxiety trigger for her Inhalers refilled Recommend she do her CPE in the next 3-6 months  Orders: -     Albuterol  Sulfate HFA; Inhale 2 puffs into the lungs every 4 (four) hours as needed for wheezing or shortness of breath.  Dispense: 18 g; Refill: 2 -     Symbicort ; Inhale 2 puffs into the lungs 2 (two) times daily.  Dispense: 30.6 g; Refill: 1 -     Spiriva  Respimat; USE 2 INHALATAIONS ONCE DAILY  Dispense: 12 g; Refill: 1  Seasonal allergic  rhinitis, unspecified trigger Assessment & Plan: Stable sx control on antihistamine and montelukast    Situational anxiety Assessment & Plan: Severe sx had worsened and pt is now working with psychiatry and therapist  On zoloft 25 mg BID and hydroxyzine  at bedtime has PRN ativan    Other specified phobia Pt having panic sx severe when her husband leaves the house or if she has to leave the house - per psych  Pt due for CPE in Sept, but allow more time for pt to get better control of psych sx - recommend coming into the office in 3-6 months for CPE and in person f/up   -Red flags and when to present for emergency care or RTC including fever >101.26F, chest pain, shortness of breath, new/worsening/un-resolving symptoms, reviewed with patient at time of visit. Follow up  and care instructions discussed and provided in AVS. - I discussed the assessment and treatment plan with the patient. The patient was provided an opportunity to ask questions and all were answered. The patient agreed with the plan and demonstrated an understanding of the instructions.  I provided 18+ minutes of non-face-to-face time during this encounter.  Michelene Cower, PA-C 11/01/23 3:40 PM

## 2023-11-01 NOTE — Assessment & Plan Note (Signed)
 Stable sx control on antihistamine and montelukast 

## 2023-11-01 NOTE — Assessment & Plan Note (Signed)
 No recent exacerbations requiring steroids, but she is having some SOB episodes and has been afraid to use her rescue inhaler SOB is associated with severe anxiety Recommend trial of albuterol  when not anxious or SOB to remember how she feels with albuterol  SE so this is not an additional anxiety trigger for her Inhalers refilled Recommend she do her CPE in the next 3-6 months

## 2023-11-01 NOTE — Assessment & Plan Note (Signed)
 Severe sx had worsened and pt is now working with psychiatry and therapist  On zoloft 25 mg BID and hydroxyzine  at bedtime has PRN ativan 

## 2024-01-01 ENCOUNTER — Ambulatory Visit: Admitting: Family Medicine

## 2024-01-01 VITALS — BP 132/76 | HR 91 | Resp 16 | Ht 59.0 in | Wt 95.2 lb

## 2024-01-01 DIAGNOSIS — Z0289 Encounter for other administrative examinations: Secondary | ICD-10-CM

## 2024-01-01 DIAGNOSIS — J449 Chronic obstructive pulmonary disease, unspecified: Secondary | ICD-10-CM

## 2024-01-01 DIAGNOSIS — F418 Other specified anxiety disorders: Secondary | ICD-10-CM | POA: Diagnosis not present

## 2024-01-01 DIAGNOSIS — Z1231 Encounter for screening mammogram for malignant neoplasm of breast: Secondary | ICD-10-CM

## 2024-01-01 DIAGNOSIS — J302 Other seasonal allergic rhinitis: Secondary | ICD-10-CM

## 2024-01-01 DIAGNOSIS — R251 Tremor, unspecified: Secondary | ICD-10-CM

## 2024-01-01 DIAGNOSIS — F4001 Agoraphobia with panic disorder: Secondary | ICD-10-CM

## 2024-01-01 DIAGNOSIS — K219 Gastro-esophageal reflux disease without esophagitis: Secondary | ICD-10-CM | POA: Insufficient documentation

## 2024-01-01 DIAGNOSIS — Z5181 Encounter for therapeutic drug level monitoring: Secondary | ICD-10-CM

## 2024-01-01 MED ORDER — ALBUTEROL SULFATE (2.5 MG/3ML) 0.083% IN NEBU: 2.5000 mg | INHALATION_SOLUTION | Freq: Four times a day (QID) | RESPIRATORY_TRACT | 0 refills | Status: AC | PRN

## 2024-01-01 MED ORDER — SPIRIVA RESPIMAT 2.5 MCG/ACT IN AERS: INHALATION_SPRAY | RESPIRATORY_TRACT | 1 refills | Status: AC

## 2024-01-01 MED ORDER — SYMBICORT 160-4.5 MCG/ACT IN AERO: 2.0000 | INHALATION_SPRAY | Freq: Two times a day (BID) | RESPIRATORY_TRACT | 1 refills | Status: AC

## 2024-01-01 MED ORDER — PANTOPRAZOLE SODIUM 20 MG PO TBEC: DELAYED_RELEASE_TABLET | ORAL | 3 refills | Status: AC

## 2024-01-01 MED ORDER — PROPRANOLOL HCL 10 MG PO TABS: 10.0000 mg | ORAL_TABLET | Freq: Three times a day (TID) | ORAL | 0 refills | Status: AC | PRN

## 2024-01-01 MED ORDER — MONTELUKAST SODIUM 10 MG PO TABS: 10.0000 mg | ORAL_TABLET | Freq: Every day | ORAL | 3 refills | Status: AC

## 2024-01-01 NOTE — Progress Notes (Signed)
 Patient ID: Brittney Doyle, female    DOB: 1960-10-03, 63 y.o.   MRN: [id number]  PCP: Leavy Mole, PA-C  Chief Complaint  Patient presents with   [facility]    Needs forms filled out to keep her working remotely. Dx fear of getting sick and social anxieties. Would like accomodation request to remain a remote worker.    Subjective:   Brittney Doyle is a 63 y.o. female, presents to clinic with CC of the following:  HPI   Discussed the use of AI scribe software for clinical note transcription with the patient, who gave verbal consent to proceed.  History of Present Illness Brittney Doyle is a 63 year old female with anxiety and panic attacks who presents for management of worsening symptoms.  Anxiety and panic symptoms - Worsening anxiety and panic attacks over the past two years - Episodes initially sporadic, now occurring daily - Panic attacks are particularly debilitating and significantly impact daily activities - Morning anxiety with awakening tremors and difficulty calming down - Difficulty with driving due to anxiety, prefers working from home - Wakes up in the middle of the night without a clear reason - High blood pressure readings during anxiety episodes  Psychotropic medication use and response - Buspar  previously trialed without noticeable effect - Currently uses hydroxyzine  at bedtime - Ativan  used as needed for severe anxiety episodes, including for appointments and dental visits - Sertraline taken as a split dose: 25 mg at night and 50 mg in the morning due to tremors with higher night dose - Propranolol  prescribed but not used due to concerns about COPD - No current engagement in therapy - Under care of a psychiatrist for medication management and ADA accommodations  Occupational stressors - Work schedule is demanding, approximately sixteen hours a day, often late into the night due to system updates - ADA accommodations in place to allow working  from home  Asthma and copd management - unclear dx, pt has previously declined to come into office or consult with pulm - Uses Singulair  (montelukast ) for asthma, with concern about potential contribution to anxiety symptoms - Has not attempted to discontinue Singulair  due to fear of asthma exacerbation - Uses Symbicort  and Spiriva  for COPD management - Albuterol  rescue inhaler available and nebs  Gastroesophageal reflux disease - Takes medication for acid reflux      11/01/2023    2:44 PM 12/23/2022    8:45 AM 12/31/2021    8:35 AM  Depression screen PHQ 2/9  Decreased Interest 0 0 0  Down, Depressed, Hopeless 0 0 0  PHQ - 2 Score 0 0 0  Altered sleeping 0 0 0  Tired, decreased energy 0 0 0  Change in appetite 0 0 0  Feeling bad or failure about yourself  0 0 0  Trouble concentrating 0 0 0  Moving slowly or fidgety/restless 0 0 0  Suicidal thoughts 0 0 0  PHQ-9 Score 0 0 0  Difficult doing work/chores  Not difficult at all Not difficult at all      11/01/2023    2:45 PM 12/23/2022    8:50 AM 12/31/2021    8:35 AM 09/16/2021   10:30 AM  GAD 7 : Generalized Anxiety Score  Nervous, Anxious, on Edge 1 1 1 1   Control/stop worrying 0 1 1 0  Worry too much - different things 0 1 1 0  Trouble relaxing 0 1 0 0  Restless 0 1 0 0  Easily  annoyed or irritable 0 0 0 0  Afraid - awful might happen 0 0 0 0  Total GAD 7 Score 1 5 3 1   Anxiety Difficulty  Somewhat difficult Somewhat difficult Not difficult at all      Patient Active Problem List   Diagnosis Date Noted   Purpura, nonthrombocytopenic (HCC) 06/04/2019   HLD (hyperlipidemia) 12/08/2017   Abnormal liver enzymes 12/08/2017   Macrocytosis 10/04/2017   Situational anxiety 11/09/2015   Seasonal allergic rhinitis 07/30/2015   COPD (chronic obstructive pulmonary disease) (HCC) 06/03/2015      Current Outpatient Medications:    albuterol  (PROVENTIL ) (2.5 MG/3ML) 0.083% nebulizer solution, Take 3 mLs (2.5 mg total) by  nebulization every 6 (six) hours as needed for wheezing or shortness of breath., Disp: 50 mL, Rfl: 0   albuterol  (VENTOLIN  HFA) 108 (90 Base) MCG/ACT inhaler, Inhale 2 puffs into the lungs every 4 (four) hours as needed for wheezing or shortness of breath., Disp: 18 g, Rfl: 2   hydrOXYzine  (VISTARIL ) 25 MG capsule, Take 25 mg by mouth at bedtime., Disp: , Rfl:    LORazepam  (ATIVAN ) 1 MG tablet, Take 0.5-1 mg by mouth daily as needed., Disp: , Rfl:    montelukast  (SINGULAIR ) 10 MG tablet, TAKE 1 TABLET BY MOUTH AT  BEDTIME, Disp: 90 tablet, Rfl: 3   pantoprazole  (PROTONIX ) 20 MG tablet, TAKE 1 TABLET BY MOUTH IN THE  MORNING 1 HOUR BEFORE BREAKFAST, Disp: 90 tablet, Rfl: 1   sertraline (ZOLOFT) 50 MG tablet, Take 25-50 mg by mouth in the morning and at bedtime. Take 25mg  in the morning and 50mg  at night, Disp: , Rfl:    SYMBICORT  160-4.5 MCG/ACT inhaler, Inhale 2 puffs into the lungs 2 (two) times daily., Disp: 30.6 g, Rfl: 1   Tiotropium Bromide  Monohydrate (SPIRIVA  RESPIMAT) 2.5 MCG/ACT AERS, USE 2 INHALATAIONS ONCE DAILY, Disp: 12 g, Rfl: 1   zinc sulfate 220 (50 Zn) MG capsule, Take 220 mg by mouth daily., Disp: , Rfl:    No Known Allergies   Social History   Tobacco Use   Smoking status: Former    Current packs/day: 0.00    Types: Cigarettes    Quit date: 04/19/2007    Years since quitting: 16.7   Smokeless tobacco: Never  Vaping Use   Vaping status: Never Used  Substance Use Topics   Alcohol use: Yes    Comment: occasional   Drug use: Never      Chart Review Today: I personally reviewed active problem list, medication list, allergies, family history, social history, health maintenance, notes from last encounter, lab results, imaging with the patient/caregiver today.   Review of Systems  Constitutional: Negative.   HENT: Negative.    Eyes: Negative.   Respiratory: Negative.    Cardiovascular: Negative.   Gastrointestinal: Negative.   Endocrine: Negative.    Genitourinary: Negative.   Musculoskeletal: Negative.   Skin: Negative.   Allergic/Immunologic: Negative.   Neurological: Negative.   Hematological: Negative.   Psychiatric/Behavioral: Negative.    All other systems reviewed and are negative.      Objective:   Vitals:   01/01/24 1347  BP: 132/76  Pulse: 91  Resp: 16  SpO2: 96%  Weight: 95 lb 3.2 oz (43.2 kg)  Height: 4' 11 (1.499 m)    Body mass index is 19.23 kg/m.  Physical Exam Vitals and nursing note reviewed.  Constitutional:      General: She is not in acute distress.    Appearance: Normal  appearance. She is well-developed. She is not ill-appearing, toxic-appearing or diaphoretic.  HENT:     Head: Normocephalic and atraumatic.     Right Ear: External ear normal.     Left Ear: External ear normal.     Nose: Nose normal.  Eyes:     General: No scleral icterus.       Right eye: No discharge.        Left eye: No discharge.     Conjunctiva/sclera: Conjunctivae normal.  Neck:     Trachea: No tracheal deviation.  Cardiovascular:     Rate and Rhythm: Normal rate and regular rhythm.     Pulses: Normal pulses.     Heart sounds: Normal heart sounds.  Pulmonary:     Effort: Pulmonary effort is normal. No respiratory distress.     Breath sounds: No stridor.  Skin:    General: Skin is warm and dry.     Findings: No rash.  Neurological:     Mental Status: She is alert.     Motor: Tremor present. No abnormal muscle tone.     Coordination: Coordination normal.     Gait: Gait normal.  Psychiatric:        Attention and Perception: Attention normal.        Mood and Affect: Affect normal. Mood is anxious.        Speech: Speech normal.        Behavior: Behavior normal. Behavior is cooperative.        Thought Content: Thought content normal.        Cognition and Memory: Memory is impaired.     Comments: Appears anxious jittery      Results for orders placed or performed in visit on 09/16/21  CBC with  Differential/Platelet   Collection Time: 09/16/21 12:00 AM  Result Value Ref Range   WBC 6.3 3.4 - 10.8 x10E3/uL   RBC 4.76 3.77 - 5.28 x10E6/uL   Hemoglobin 15.8 11.1 - 15.9 g/dL   Hematocrit 52.9 (H) 65.9 - 46.6 %   MCV 99 (H) 79 - 97 fL   MCH 33.2 (H) 26.6 - 33.0 pg   MCHC 33.6 31.5 - 35.7 g/dL   RDW 88.0 88.2 - 84.5 %   Platelets 301 150 - 450 x10E3/uL   Neutrophils 69 Not Estab. %   Lymphs 22 Not Estab. %   Monocytes 7 Not Estab. %   Eos 1 Not Estab. %   Basos 1 Not Estab. %   Neutrophils Absolute 4.3 1.4 - 7.0 x10E3/uL   Lymphocytes Absolute 1.4 0.7 - 3.1 x10E3/uL   Monocytes Absolute 0.4 0.1 - 0.9 x10E3/uL   EOS (ABSOLUTE) 0.1 0.0 - 0.4 x10E3/uL   Basophils Absolute 0.1 0.0 - 0.2 x10E3/uL   Immature Granulocytes 0 Not Estab. %   Immature Grans (Abs) 0.0 0.0 - 0.1 x10E3/uL  Comprehensive metabolic panel   Collection Time: 09/16/21 12:00 AM  Result Value Ref Range   Glucose 103 (H) 70 - 99 mg/dL   BUN 8 8 - 27 mg/dL   Creatinine, Ser 9.38 0.57 - 1.00 mg/dL   eGFR 897 >40 fO/fpw/8.26   BUN/Creatinine Ratio 13 12 - 28   Sodium 140 134 - 144 mmol/L   Potassium 3.9 3.5 - 5.2 mmol/L   Chloride 101 96 - 106 mmol/L   CO2 25 20 - 29 mmol/L   Calcium  9.7 8.7 - 10.3 mg/dL   Total Protein 7.2 6.0 - 8.5 g/dL   Albumin 4.4 3.8 -  4.9 g/dL   Globulin, Total 2.8 1.5 - 4.5 g/dL   Albumin/Globulin Ratio 1.6 1.2 - 2.2   Bilirubin Total 0.6 0.0 - 1.2 mg/dL   Alkaline Phosphatase 130 (H) 44 - 121 IU/L   AST 30 0 - 40 IU/L   ALT 33 (H) 0 - 32 IU/L  Lipid panel   Collection Time: 09/16/21 12:00 AM  Result Value Ref Range   Cholesterol, Total 208 (H) 100 - 199 mg/dL   Triglycerides 59 0 - 149 mg/dL   HDL 90 >60 mg/dL   VLDL Cholesterol Cal 11 5 - 40 mg/dL   LDL Chol Calc (NIH) 892 (H) 0 - 99 mg/dL   Chol/HDL Ratio 2.3 0.0 - 4.4 ratio  Hemoglobin A1c   Collection Time: 09/16/21 12:00 AM  Result Value Ref Range   Hgb A1c MFr Bld 5.4 4.8 - 5.6 %   Est. average glucose Bld gHb  Est-mCnc 108 mg/dL  TSH   Collection Time: 09/16/21 12:00 AM  Result Value Ref Range   TSH 1.670 0.450 - 4.500 uIU/mL  HIV Antibody (routine testing w rflx)   Collection Time: 09/16/21 12:00 AM  Result Value Ref Range   HIV Screen 4th Generation wRfx Non Reactive Non Reactive  VITAMIN D  25 Hydroxy (Vit-D Deficiency, Fractures)   Collection Time: 09/16/21 12:00 AM  Result Value Ref Range   Vit D, 25-Hydroxy 81.0 30.0 - 100.0 ng/mL       Assessment & Plan:   Pt here for forms and overview of meds/refills    ICD-10-CM   1. 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 Situational anxiety  F41.8 sertraline (ZOLOFT) 50 MG tablet    Thyroid Panel With TSH   see above, on zoloft 50 mg QAM, 25 mg QPM ativan  and hydroxyzine  prn    4. Chronic obstructive pulmonary disease, unspecified COPD type (HCC)  J44.9 SYMBICORT  160-4.5 MCG/ACT inhaler    Tiotropium Bromide  Monohydrate (SPIRIVA  RESPIMAT) 2.5 MCG/ACT AERS    albuterol  (PROVENTIL ) (2.5 MG/3ML) 0.083% nebulizer solution   managed on symbicort  spiriva  and rescue inhalers She has been encouraged to     5. Encounter for screening mammogram for malignant neoplasm of breast  Z12.31 MM 3D SCREENING MAMMOGRAM BILATERAL BREAST    6. Tremor  R25.1 propranolol  (INDERAL ) 10 MG tablet   consider trial of prn propanolol dosing    7. Gastroesophageal reflux disease, unspecified whether esophagitis present  K21.9 pantoprazole  (PROTONIX ) 20 MG tablet   dependent on daily PPI    8. Seasonal allergic rhinitis, unspecified trigger   J30.2    consider stopping singulair  and trying 2nd generation antihistamine instead    9. Encounter for medication monitoring  Z51.81    discussed possible med changes -  Reviewed singulair  black box warning adverse SE and discussed with pt she may consider holding singulair  to see if it at all could be causing her sig increased severe anxiety sx/jitters/tremors etc, recommend she replace with 2nd gen antihistamine like xyzal/zyrtec/claritin etc  She may also want  to consider trial of propanolol for sx - she is concerned about interactions with her COPD and inhalers - we reviewed the dosing, MOA duration and half life of propanolol - up to her discretion to decide to try a dose or not -  would not expect the med SE to last more than 6-8 hours      Assessment & Plan 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 Current medications include sertraline, hydroxyzine , and Ativan  as needed. Propranolol  was considered but not started due to concerns about COPD. Discussed the potential role of montelukast  in exacerbating anxiety symptoms. Psychiatrist is managing medications and ADA accommodations for working from home. - Continue sertraline, hydroxyzine , and Ativan  as needed. - Prescribe propranolol  10 mg tablet, with option to halve the dose, for situational anxiety such as presentations. - Consider holding montelukast  for one week to assess impact on anxiety symptoms. - Coordinate with psychiatrist for ADA accommodations and medication management. - Order thyroid panel to rule out hyperthyroidism as a contributing factor to anxiety.  Chronic obstructive pulmonary disease (COPD) Concerns about propranolol  affecting COPD symptoms were discussed. - Continue Symbicort  and Spiriva .  Seasonal allergic  rhinitis Seasonal allergic rhinitis is managed with montelukast . Discussed potential neuropsychiatric side effects of montelukast , including anxiety and mood changes. - Consider alternative allergy medications such as Xyzal, Zyrtec, or Allegra if montelukast  is discontinued.  Gastroesophageal reflux disease (GERD) GERD is well-controlled with current pantoprazole  regimen. - Continue pantoprazole  20 mg daily.  Recording duration: 27 minutes   All meds refilled so pt at this time She may defer cancer screening tests due to severity of anxiety and need to focus on high priority needs first     Michelene Cower, PA-C 01/01/24 2:17 PM

## 2024-01-05 ENCOUNTER — Telehealth: Payer: Self-pay | Admitting: Family Medicine

## 2024-01-05 NOTE — Telephone Encounter (Signed)
 Copied from CRM #[id number]. Topic: General - Other >> Jan 05, 2024 10:38 AM Tiffini S wrote: Reason for CRM: Patient called for a accommodation/ ADA forms on 01/01/24 at appointment- need the forms faxed by: 12/2223 If forms was already faxed can take up to 72 hours to be updated in there records.

## 2024-01-08 NOTE — Telephone Encounter (Signed)
 Called patient and lvm notifying form was faxed this morning, and a copy was left at the front desk for her.

## 2024-01-09 ENCOUNTER — Ambulatory Visit: Payer: Self-pay | Admitting: Family Medicine

## 2024-01-09 LAB — THYROID PANEL WITH TSH
Free Thyroxine Index: 1.2 (ref 1.2–4.9)
T3 Uptake Ratio: 25 % (ref 24–39)
T4, Total: 4.9 ug/dL (ref 4.5–12.0)
TSH: 3.55 u[IU]/mL (ref 0.450–4.500)

## 2024-07-01 ENCOUNTER — Ambulatory Visit: Admitting: Family Medicine
